# Patient Record
Sex: Female | Born: 1985 | Hispanic: No | Marital: Married | Smoking: Former smoker
Health system: Southern US, Community
[De-identification: ages and names within clinical notes are randomized; demographics above are authoritative.]

## PROBLEM LIST (undated history)

## (undated) DIAGNOSIS — J45909 Unspecified asthma, uncomplicated: Secondary | ICD-10-CM

## (undated) DIAGNOSIS — M722 Plantar fascial fibromatosis: Secondary | ICD-10-CM

## (undated) DIAGNOSIS — E669 Obesity, unspecified: Secondary | ICD-10-CM

## (undated) DIAGNOSIS — L309 Dermatitis, unspecified: Secondary | ICD-10-CM

## (undated) DIAGNOSIS — Z720 Tobacco use: Secondary | ICD-10-CM

## (undated) HISTORY — DX: Obesity, unspecified: E66.9

## (undated) HISTORY — DX: Unspecified asthma, uncomplicated: J45.909

## (undated) HISTORY — DX: Dermatitis, unspecified: L30.9

## (undated) HISTORY — PX: EYE SURGERY: SHX253

## (undated) HISTORY — DX: Plantar fascial fibromatosis: M72.2

## (undated) HISTORY — DX: Tobacco use: Z72.0

## (undated) HISTORY — PX: WISDOM TOOTH EXTRACTION: SHX21

---

## 2018-12-27 ENCOUNTER — Other Ambulatory Visit: Payer: Self-pay

## 2018-12-27 DIAGNOSIS — Z20822 Contact with and (suspected) exposure to covid-19: Secondary | ICD-10-CM

## 2018-12-29 LAB — NOVEL CORONAVIRUS, NAA: SARS-CoV-2, NAA: NOT DETECTED

## 2019-01-03 ENCOUNTER — Telehealth: Payer: Self-pay | Admitting: General Practice

## 2019-01-03 NOTE — Telephone Encounter (Signed)
Negative COVID results given. Patient results "NOT Detected." Caller expressed understanding. ° °

## 2019-05-12 ENCOUNTER — Other Ambulatory Visit: Payer: 59

## 2019-07-19 ENCOUNTER — Telehealth: Payer: Self-pay | Admitting: Cardiology

## 2019-07-19 NOTE — Telephone Encounter (Signed)
error 

## 2019-11-10 ENCOUNTER — Telehealth: Payer: 59

## 2019-11-21 ENCOUNTER — Other Ambulatory Visit: Payer: Self-pay

## 2019-11-21 ENCOUNTER — Encounter: Payer: Self-pay | Admitting: Internal Medicine

## 2019-11-21 ENCOUNTER — Ambulatory Visit: Payer: 59 | Admitting: Internal Medicine

## 2019-11-21 VITALS — BP 110/80 | HR 76 | Temp 98.2°F | Ht 66.0 in | Wt 223.5 lb

## 2019-11-21 DIAGNOSIS — L309 Dermatitis, unspecified: Secondary | ICD-10-CM

## 2019-11-21 DIAGNOSIS — Z23 Encounter for immunization: Secondary | ICD-10-CM | POA: Diagnosis not present

## 2019-11-21 DIAGNOSIS — E669 Obesity, unspecified: Secondary | ICD-10-CM | POA: Diagnosis not present

## 2019-11-21 DIAGNOSIS — Z0000 Encounter for general adult medical examination without abnormal findings: Secondary | ICD-10-CM

## 2019-11-21 DIAGNOSIS — Z720 Tobacco use: Secondary | ICD-10-CM

## 2019-11-21 MED ORDER — NICOTINE 21 MG/24HR TD PT24: 21.0000 mg | MEDICATED_PATCH | Freq: Every day | TRANSDERMAL | 1 refills | Status: DC

## 2019-11-21 MED ORDER — TRIAMCINOLONE ACETONIDE 0.1 % EX CREA: 1.0000 "application " | TOPICAL_CREAM | Freq: Two times a day (BID) | CUTANEOUS | 0 refills | Status: DC

## 2019-11-21 NOTE — Progress Notes (Signed)
New Patient Office Visit     This visit occurred during the SARS-CoV-2 public health emergency.  Safety protocols were in place, including screening questions prior to the visit, additional usage of staff PPE, and extensive cleaning of exam room while observing appropriate contact time as indicated for disinfecting solutions.    CC/Reason for Visit: Establish care, annual preventive exam, discuss acute concerns Previous PCP: None Last Visit: Unknown  HPI: Dana Adkins is a 34 y.o. female who is coming in today for the above mentioned reasons. Past Medical History is significant for: Tobacco abuse, has been smoking 1 pack a day for about 6 years, she is interested in quitting.  She also has eczema and is wondering what she can do about this.  She used to be in [employer], currently works for the [employer], is married to a same-sex partner.  She moved to the area about 3 years ago.  They have no children.  She does not drink, she has no known drug allergies, past surgical history is only significant for LASEK eye surgery in 2010.  Her father is currently battling liver cancer.  She is due for Covid, Tdap, flu vaccines.   Past Medical/Surgical History: Past Medical History:  Diagnosis Date  . Eczema   . Obesity (BMI 35.0-39.9 without comorbidity)   . Tobacco abuse     History reviewed. No pertinent surgical history.  Social History:  reports that she has been smoking cigarettes. She started smoking about 6 years ago. She has been smoking about 1.00 pack per day. She has never used smokeless tobacco. She reports that she does not drink alcohol and does not use drugs.  Allergies: No Known Allergies  Family History:  Family History  Problem Relation Age of Onset  . Liver cancer Father      Current Outpatient Medications:  .  nicotine (NICODERM CQ - DOSED IN MG/24 HOURS) 21 mg/24hr patch, Place 1 patch (21 mg total) onto the skin daily., Disp: 28 patch, Rfl: 1 .   triamcinolone cream (KENALOG) 0.1 %, Apply 1 application topically 2 (two) times daily., Disp: 30 g, Rfl: 0  Review of Systems:  Constitutional: Denies fever, chills, diaphoresis, appetite change and fatigue.  HEENT: Denies photophobia, eye pain, redness, hearing loss, ear pain, congestion, sore throat, rhinorrhea, sneezing, mouth sores, trouble swallowing, neck pain, neck stiffness and tinnitus.   Respiratory: Denies SOB, DOE, cough, chest tightness,  and wheezing.   Cardiovascular: Denies chest pain, palpitations and leg swelling.  Gastrointestinal: Denies nausea, vomiting, abdominal pain, diarrhea, constipation, blood in stool and abdominal distention.  Genitourinary: Denies dysuria, urgency, frequency, hematuria, flank pain and difficulty urinating.  Endocrine: Denies: hot or cold intolerance, sweats, changes in hair or nails, polyuria, polydipsia. Musculoskeletal: Denies myalgias, back pain, joint swelling, arthralgias and gait problem.  Skin: Denies pallor, rash and wound.  Neurological: Denies dizziness, seizures, syncope, weakness, light-headedness, numbness and headaches.  Hematological: Denies adenopathy. Easy bruising, personal or family bleeding history  Psychiatric/Behavioral: Denies suicidal ideation, mood changes, confusion, nervousness, sleep disturbance and agitation    Physical Exam: Vitals:   11/21/19 1019  BP: 110/80  Pulse: 76  Temp: 98.2 F (36.8 C)  TempSrc: Oral  SpO2: 98%  Weight: 223 lb 8 oz (101.4 kg)  Height: 5' 6"  (1.676 m)   Body mass index is 36.07 kg/m.  Constitutional: NAD, calm, comfortable Eyes: PERRL, lids and conjunctivae normal ENMT: Mucous membranes are moist.  Respiratory: clear to auscultation bilaterally, no wheezing,  no crackles. Normal respiratory effort. No accessory muscle use.  Cardiovascular: Regular rate and rhythm, no murmurs / rubs / gallops. No extremity edema.  Abdomen: no tenderness, no masses palpated. No hepatosplenomegaly.  Bowel sounds positive.  Musculoskeletal: no clubbing / cyanosis. No joint deformity upper and lower extremities. Good ROM, no contractures. Normal muscle tone.  Neurologic: CN 2-12 grossly intact. Sensation intact, DTR normal. Strength 5/5 in all 4.  Psychiatric: Normal judgment and insight. Alert and oriented x 3. Normal mood.    Impression and Plan:  Encounter for preventive health examination  -I have advised routine eye and dental care. -She has agreed to Tdap booster today, declines flu and Covid vaccines. -Screening labs today. -Healthy lifestyle discussed in detail. -Commence routine colon cancer screening age 33, breast cancer screening age 1. -She has a GYN who does her Pap smears.  Tobacco abuse  - Plan: nicotine (NICODERM CQ - DOSED IN MG/24 HOURS) 21 mg/24hr patch -She will see me in 6 weeks for follow-up.  Obesity (BMI 35.0-39.9 without comorbidity) -Discussed healthy lifestyle, including increased physical activity and better food choices to promote weight loss. -She has been walking on a daily basis about 3 miles.  Eczema, unspecified type  - Plan: triamcinolone cream (KENALOG) 0.1 %     Patient Instructions  -Nice seeing you today!!  -Lab work today; will notify you once results are available.  -Start nicotine patches. Change daily at the same time.  -Triamcinolone cream as needed for eczema.  -Tetanus booster today.  -Consider getting your COVID vaccines.  -Remember to schedule your eye and dental exams.  -Schedule follow up in 6 weeks for smoking.   Preventive Care 81-56 Years Old, Female Preventive care refers to visits with your health care provider and lifestyle choices that can promote health and wellness. This includes:  A yearly physical exam. This may also be called an annual well check.  Regular dental visits and eye exams.  Immunizations.  Screening for certain conditions.  Healthy lifestyle choices, such as eating a healthy diet,  getting regular exercise, not using drugs or products that contain nicotine and tobacco, and limiting alcohol use. What can I expect for my preventive care visit? Physical exam Your health care provider will check your:  Height and weight. This may be used to calculate body mass index (BMI), which tells if you are at a healthy weight.  Heart rate and blood pressure.  Skin for abnormal spots. Counseling Your health care provider may ask you questions about your:  Alcohol, tobacco, and drug use.  Emotional well-being.  Home and relationship well-being.  Sexual activity.  Eating habits.  Work and work Statistician.  Method of birth control.  Menstrual cycle.  Pregnancy history. What immunizations do I need?  Influenza (flu) vaccine  This is recommended every year. Tetanus, diphtheria, and pertussis (Tdap) vaccine  You may need a Td booster every 10 years. Varicella (chickenpox) vaccine  You may need this if you have not been vaccinated. Human papillomavirus (HPV) vaccine  If recommended by your health care provider, you may need three doses over 6 months. Measles, mumps, and rubella (MMR) vaccine  You may need at least one dose of MMR. You may also need a second dose. Meningococcal conjugate (MenACWY) vaccine  One dose is recommended if you are age 60-21 years and a first-year college student living in a residence hall, or if you have one of several medical conditions. You may also need additional booster doses. Pneumococcal conjugate (PCV13)  vaccine  You may need this if you have certain conditions and were not previously vaccinated. Pneumococcal polysaccharide (PPSV23) vaccine  You may need one or two doses if you smoke cigarettes or if you have certain conditions. Hepatitis A vaccine  You may need this if you have certain conditions or if you travel or work in places where you may be exposed to hepatitis A. Hepatitis B vaccine  You may need this if you have  certain conditions or if you travel or work in places where you may be exposed to hepatitis B. Haemophilus influenzae type b (Hib) vaccine  You may need this if you have certain conditions. You may receive vaccines as individual doses or as more than one vaccine together in one shot (combination vaccines). Talk with your health care provider about the risks and benefits of combination vaccines. What tests do I need?  Blood tests  Lipid and cholesterol levels. These may be checked every 5 years starting at age 48.  Hepatitis C test.  Hepatitis B test. Screening  Diabetes screening. This is done by checking your blood sugar (glucose) after you have not eaten for a while (fasting).  Sexually transmitted disease (STD) testing.  BRCA-related cancer screening. This may be done if you have a family history of breast, ovarian, tubal, or peritoneal cancers.  Pelvic exam and Pap test. This may be done every 3 years starting at age 64. Starting at age 3, this may be done every 5 years if you have a Pap test in combination with an HPV test. Talk with your health care provider about your test results, treatment options, and if necessary, the need for more tests. Follow these instructions at home: Eating and drinking   Eat a diet that includes fresh fruits and vegetables, whole grains, lean protein, and low-fat dairy.  Take vitamin and mineral supplements as recommended by your health care provider.  Do not drink alcohol if: ? Your health care provider tells you not to drink. ? You are pregnant, may be pregnant, or are planning to become pregnant.  If you drink alcohol: ? Limit how much you have to 0-1 drink a day. ? Be aware of how much alcohol is in your drink. In the [place] one drink equals one 12 oz bottle of beer (355 mL), one 5 oz glass of wine (148 mL), or one 1 oz glass of hard liquor (44 mL). Lifestyle  Take daily care of your teeth and gums.  Stay active. Exercise for at least  30 minutes on 5 or more days each week.  Do not use any products that contain nicotine or tobacco, such as cigarettes, e-cigarettes, and chewing tobacco. If you need help quitting, ask your health care provider.  If you are sexually active, practice safe sex. Use a condom or other form of birth control (contraception) in order to prevent pregnancy and STIs (sexually transmitted infections). If you plan to become pregnant, see your health care provider for a preconception visit. What's next?  Visit your health care provider once a year for a well check visit.  Ask your health care provider how often you should have your eyes and teeth checked.  Stay up to date on all vaccines. This information is not intended to replace advice given to you by your health care provider. Make sure you discuss any questions you have with your health care provider. Document Revised: 12/16/2017 Document Reviewed: 12/16/2017 Elsevier Patient Education  2020 [employer].  Lelon Frohlich, MD [facility]

## 2019-11-21 NOTE — Addendum Note (Signed)
Addended by: Kern Reap B on: 11/21/2019 12:58 PM   Modules accepted: Orders

## 2019-11-21 NOTE — Patient Instructions (Signed)
-Nice seeing you today!!  -Lab work today; will notify you once results are available.  -Start nicotine patches. Change daily at the same time.  -Triamcinolone cream as needed for eczema.  -Tetanus booster today.  -Consider getting your COVID vaccines.  -Remember to schedule your eye and dental exams.  -Schedule follow up in 6 weeks for smoking.   Preventive Care 19-35 Years Old, Female Preventive care refers to visits with your health care provider and lifestyle choices that can promote health and wellness. This includes:  A yearly physical exam. This may also be called an annual well check.  Regular dental visits and eye exams.  Immunizations.  Screening for certain conditions.  Healthy lifestyle choices, such as eating a healthy diet, getting regular exercise, not using drugs or products that contain nicotine and tobacco, and limiting alcohol use. What can I expect for my preventive care visit? Physical exam Your health care provider will check your:  Height and weight. This may be used to calculate body mass index (BMI), which tells if you are at a healthy weight.  Heart rate and blood pressure.  Skin for abnormal spots. Counseling Your health care provider may ask you questions about your:  Alcohol, tobacco, and drug use.  Emotional well-being.  Home and relationship well-being.  Sexual activity.  Eating habits.  Work and work Statistician.  Method of birth control.  Menstrual cycle.  Pregnancy history. What immunizations do I need?  Influenza (flu) vaccine  This is recommended every year. Tetanus, diphtheria, and pertussis (Tdap) vaccine  You may need a Td booster every 10 years. Varicella (chickenpox) vaccine  You may need this if you have not been vaccinated. Human papillomavirus (HPV) vaccine  If recommended by your health care provider, you may need three doses over 6 months. Measles, mumps, and rubella (MMR) vaccine  You may need at  least one dose of MMR. You may also need a second dose. Meningococcal conjugate (MenACWY) vaccine  One dose is recommended if you are age 16-21 years and a first-year college student living in a residence hall, or if you have one of several medical conditions. You may also need additional booster doses. Pneumococcal conjugate (PCV13) vaccine  You may need this if you have certain conditions and were not previously vaccinated. Pneumococcal polysaccharide (PPSV23) vaccine  You may need one or two doses if you smoke cigarettes or if you have certain conditions. Hepatitis A vaccine  You may need this if you have certain conditions or if you travel or work in places where you may be exposed to hepatitis A. Hepatitis B vaccine  You may need this if you have certain conditions or if you travel or work in places where you may be exposed to hepatitis B. Haemophilus influenzae type b (Hib) vaccine  You may need this if you have certain conditions. You may receive vaccines as individual doses or as more than one vaccine together in one shot (combination vaccines). Talk with your health care provider about the risks and benefits of combination vaccines. What tests do I need?  Blood tests  Lipid and cholesterol levels. These may be checked every 5 years starting at age 35.  Hepatitis C test.  Hepatitis B test. Screening  Diabetes screening. This is done by checking your blood sugar (glucose) after you have not eaten for a while (fasting).  Sexually transmitted disease (STD) testing.  BRCA-related cancer screening. This may be done if you have a family history of breast, ovarian, tubal, or  peritoneal cancers.  Pelvic exam and Pap test. This may be done every 3 years starting at age 35. Starting at age 75, this may be done every 5 years if you have a Pap test in combination with an HPV test. Talk with your health care provider about your test results, treatment options, and if necessary, the  need for more tests. Follow these instructions at home: Eating and drinking   Eat a diet that includes fresh fruits and vegetables, whole grains, lean protein, and low-fat dairy.  Take vitamin and mineral supplements as recommended by your health care provider.  Do not drink alcohol if: ? Your health care provider tells you not to drink. ? You are pregnant, may be pregnant, or are planning to become pregnant.  If you drink alcohol: ? Limit how much you have to 0-1 drink a day. ? Be aware of how much alcohol is in your drink. In the [place] one drink equals one 12 oz bottle of beer (355 mL), one 5 oz glass of wine (148 mL), or one 1 oz glass of hard liquor (44 mL). Lifestyle  Take daily care of your teeth and gums.  Stay active. Exercise for at least 30 minutes on 5 or more days each week.  Do not use any products that contain nicotine or tobacco, such as cigarettes, e-cigarettes, and chewing tobacco. If you need help quitting, ask your health care provider.  If you are sexually active, practice safe sex. Use a condom or other form of birth control (contraception) in order to prevent pregnancy and STIs (sexually transmitted infections). If you plan to become pregnant, see your health care provider for a preconception visit. What's next?  Visit your health care provider once a year for a well check visit.  Ask your health care provider how often you should have your eyes and teeth checked.  Stay up to date on all vaccines. This information is not intended to replace advice given to you by your health care provider. Make sure you discuss any questions you have with your health care provider. Document Revised: 12/16/2017 Document Reviewed: 12/16/2017 Elsevier Patient Education  2020 [employer].

## 2019-11-22 ENCOUNTER — Encounter: Payer: Self-pay | Admitting: Internal Medicine

## 2019-11-22 DIAGNOSIS — R7401 Elevation of levels of liver transaminase levels: Secondary | ICD-10-CM | POA: Insufficient documentation

## 2019-11-22 LAB — COMPREHENSIVE METABOLIC PANEL
AG Ratio: 1.5 (calc) (ref 1.0–2.5)
ALT: 34 U/L — ABNORMAL HIGH (ref 6–29)
AST: 31 U/L — ABNORMAL HIGH (ref 10–30)
Albumin: 4 g/dL (ref 3.6–5.1)
Alkaline phosphatase (APISO): 62 U/L (ref 31–125)
BUN: 9 mg/dL (ref 7–25)
CO2: 21 mmol/L (ref 20–32)
Calcium: 8.9 mg/dL (ref 8.6–10.2)
Chloride: 109 mmol/L (ref 98–110)
Creat: 0.89 mg/dL (ref 0.50–1.10)
Globulin: 2.7 g/dL (calc) (ref 1.9–3.7)
Glucose, Bld: 77 mg/dL (ref 65–99)
Potassium: 4.6 mmol/L (ref 3.5–5.3)
Sodium: 138 mmol/L (ref 135–146)
Total Bilirubin: 0.9 mg/dL (ref 0.2–1.2)
Total Protein: 6.7 g/dL (ref 6.1–8.1)

## 2019-11-22 LAB — LIPID PANEL
Cholesterol: 176 mg/dL (ref <200)
HDL: 65 mg/dL (ref >=50)
LDL Cholesterol (Calc): 97 mg/dL (calc)
Non-HDL Cholesterol (Calc): 111 mg/dL (calc) (ref <130)
Total CHOL/HDL Ratio: 2.7 (calc) (ref <5.0)
Triglycerides: 46 mg/dL (ref <150)

## 2019-11-22 LAB — CBC WITH DIFFERENTIAL/PLATELET
Absolute Monocytes: 464 cells/uL (ref 200–950)
Basophils Absolute: 53 cells/uL (ref 0–200)
Basophils Relative: 0.7 %
Eosinophils Absolute: 129 cells/uL (ref 15–500)
Eosinophils Relative: 1.7 %
HCT: 40.8 % (ref 35.0–45.0)
Hemoglobin: 13.4 g/dL (ref 11.7–15.5)
Lymphs Abs: 1634 cells/uL (ref 850–3900)
MCH: 28.6 pg (ref 27.0–33.0)
MCHC: 32.8 g/dL (ref 32.0–36.0)
MCV: 87 fL (ref 80.0–100.0)
MPV: 11.2 fL (ref 7.5–12.5)
Monocytes Relative: 6.1 %
Neutro Abs: 5320 cells/uL (ref 1500–7800)
Neutrophils Relative %: 70 %
Platelets: 235 10*3/uL (ref 140–400)
RBC: 4.69 10*6/uL (ref 3.80–5.10)
RDW: 13.9 % (ref 11.0–15.0)
Total Lymphocyte: 21.5 %
WBC: 7.6 10*3/uL (ref 3.8–10.8)

## 2019-11-29 ENCOUNTER — Telehealth: Payer: Self-pay | Admitting: Internal Medicine

## 2019-11-29 NOTE — Telephone Encounter (Signed)
Pt was returning Rachel's call about labs. Pt can be reached at [phone]

## 2019-12-01 ENCOUNTER — Other Ambulatory Visit: Payer: Self-pay | Admitting: *Deleted

## 2019-12-01 DIAGNOSIS — R945 Abnormal results of liver function studies: Secondary | ICD-10-CM

## 2019-12-01 NOTE — Telephone Encounter (Signed)
Spoke with patient and reviewed lab results. 

## 2020-01-15 ENCOUNTER — Other Ambulatory Visit: Payer: Self-pay

## 2020-03-12 ENCOUNTER — Emergency Department (HOSPITAL_COMMUNITY)
Admission: EM | Admit: 2020-03-12 | Discharge: 2020-03-12 | Disposition: A | Discharge status: Home / Self Care | Payer: 59 | Attending: Emergency Medicine | Admitting: Emergency Medicine

## 2020-03-12 ENCOUNTER — Emergency Department (HOSPITAL_COMMUNITY): Payer: 59

## 2020-03-12 ENCOUNTER — Encounter (HOSPITAL_COMMUNITY): Payer: Self-pay

## 2020-03-12 ENCOUNTER — Other Ambulatory Visit: Payer: Self-pay

## 2020-03-12 DIAGNOSIS — R1011 Right upper quadrant pain: Secondary | ICD-10-CM | POA: Diagnosis not present

## 2020-03-12 DIAGNOSIS — F17210 Nicotine dependence, cigarettes, uncomplicated: Secondary | ICD-10-CM | POA: Diagnosis not present

## 2020-03-12 LAB — COMPREHENSIVE METABOLIC PANEL
ALT: 25 U/L (ref 0–44)
AST: 23 U/L (ref 15–41)
Albumin: 4 g/dL (ref 3.5–5.0)
Alkaline Phosphatase: 69 U/L (ref 38–126)
Anion gap: 8 (ref 5–15)
BUN: 7 mg/dL (ref 6–20)
CO2: 23 mmol/L (ref 22–32)
Calcium: 8.5 mg/dL — ABNORMAL LOW (ref 8.9–10.3)
Chloride: 107 mmol/L (ref 98–111)
Creatinine, Ser: 0.7 mg/dL (ref 0.44–1.00)
GFR, Estimated: >60 mL/min (ref >60)
Glucose, Bld: 84 mg/dL (ref 70–99)
Potassium: 3.7 mmol/L (ref 3.5–5.1)
Sodium: 138 mmol/L (ref 135–145)
Total Bilirubin: 0.8 mg/dL (ref 0.3–1.2)
Total Protein: 7.2 g/dL (ref 6.5–8.1)

## 2020-03-12 LAB — LIPASE, BLOOD: Lipase: 37 U/L (ref 11–51)

## 2020-03-12 LAB — URINALYSIS, ROUTINE W REFLEX MICROSCOPIC
Bilirubin Urine: NEGATIVE
Glucose, UA: NEGATIVE mg/dL
Hgb urine dipstick: NEGATIVE
Ketones, ur: NEGATIVE mg/dL
Leukocytes,Ua: NEGATIVE
Nitrite: NEGATIVE
Protein, ur: NEGATIVE mg/dL
Specific Gravity, Urine: 1.01 (ref 1.005–1.030)
pH: 6 (ref 5.0–8.0)

## 2020-03-12 LAB — CBC
HCT: 40 % (ref 36.0–46.0)
Hemoglobin: 13.5 g/dL (ref 12.0–15.0)
MCH: 28.8 pg (ref 26.0–34.0)
MCHC: 33.8 g/dL (ref 30.0–36.0)
MCV: 85.3 fL (ref 80.0–100.0)
Platelets: 224 10*3/uL (ref 150–400)
RBC: 4.69 MIL/uL (ref 3.87–5.11)
RDW: 14 % (ref 11.5–15.5)
WBC: 9.4 10*3/uL (ref 4.0–10.5)
nRBC: 0 % (ref 0.0–0.2)

## 2020-03-12 LAB — I-STAT BETA HCG BLOOD, ED (MC, WL, AP ONLY): I-stat hCG, quantitative: <5 m[IU]/mL (ref <5)

## 2020-03-12 MED ORDER — MORPHINE SULFATE (PF) 4 MG/ML IV SOLN
4.0000 mg | Freq: Once | INTRAVENOUS | Status: AC
  Administered 2020-03-12: 4 mg via INTRAVENOUS
  Filled 2020-03-12: qty 1

## 2020-03-12 MED ORDER — SODIUM CHLORIDE (PF) 0.9 % IJ SOLN
INTRAMUSCULAR | Status: AC
  Filled 2020-03-12: qty 50

## 2020-03-12 MED ORDER — OMEPRAZOLE 20 MG PO CPDR: 20.0000 mg | DELAYED_RELEASE_CAPSULE | Freq: Every day | ORAL | 0 refills | Status: DC

## 2020-03-12 MED ORDER — IOHEXOL 300 MG/ML  SOLN
100.0000 mL | Freq: Once | INTRAMUSCULAR | Status: AC | PRN
  Administered 2020-03-12: 100 mL via INTRAVENOUS

## 2020-03-12 MED ORDER — DICYCLOMINE HCL 20 MG PO TABS: 20.0000 mg | ORAL_TABLET | Freq: Two times a day (BID) | ORAL | 0 refills | Status: DC | PRN

## 2020-03-12 NOTE — ED Provider Notes (Signed)
[facility]   CSN: [id number] Arrival date & time: 03/12/20  1651     History Chief Complaint  Patient presents with  . Abdominal Pain    Dana Adkins is a 34 y.o. female.  She is complaining of acute onset of stabbing right upper quadrant abdominal pain radiating through to her back that began about 30 minutes prior to arrival.  She said it sliding off now but was severe initially.  It occurred after she had eaten some M&Ms.  Not associate with nausea vomiting fevers chills shortness of breath diaphoresis or urinary symptoms.  No prior history of same pain.  No prior surgical history  The history is provided by the patient.  Abdominal Pain Pain location:  RUQ Pain quality: stabbing   Pain radiates to:  Back Pain severity:  Severe Onset quality:  Sudden Duration:  1 hour Timing:  Constant Progression:  Partially resolved Chronicity:  New Context: eating   Relieved by:  None tried Worsened by:  Nothing Ineffective treatments:  None tried Associated symptoms: no chest pain, no cough, no diarrhea, no dysuria, no fever, no nausea, no shortness of breath, no sore throat and no vomiting        Past Medical History:  Diagnosis Date  . Eczema   . Obesity (BMI 35.0-39.9 without comorbidity)   . Tobacco abuse     Patient Active Problem List   Diagnosis Date Noted  . Transaminitis 11/22/2019  . Tobacco abuse   . Obesity (BMI 35.0-39.9 without comorbidity)   . Eczema     History reviewed. No pertinent surgical history.   OB History   No obstetric history on file.     Family History  Problem Relation Age of Onset  . Liver cancer Father     Social History   Tobacco Use  . Smoking status: Current Every Day Smoker    Packs/day: 1.00    Types: Cigarettes    Start date: 2015  . Smokeless tobacco: Never Used  Substance Use Topics  . Alcohol use: Never  . Drug use: Never    Home Medications Prior to  Admission medications   Medication Sig Start Date End Date Taking? Authorizing Provider  nicotine (NICODERM CQ - DOSED IN MG/24 HOURS) 21 mg/24hr patch Place 1 patch (21 mg total) onto the skin daily. 11/21/19   Philip Aspen, Limmie Patricia, MD  triamcinolone cream (KENALOG) 0.1 % Apply 1 application topically 2 (two) times daily. 11/21/19   Philip Aspen, Limmie Patricia, MD    Allergies    Patient has no known allergies.  Review of Systems   Review of Systems  Constitutional: Negative for fever.  HENT: Negative for sore throat.   Eyes: Negative for visual disturbance.  Respiratory: Negative for cough and shortness of breath.   Cardiovascular: Negative for chest pain.  Gastrointestinal: Positive for abdominal pain. Negative for diarrhea, nausea and vomiting.  Genitourinary: Negative for dysuria.  Musculoskeletal: Positive for back pain.  Skin: Negative for rash.  Neurological: Negative for headaches.    Physical Exam Updated Vital Signs BP (!) 143/91 (BP Location: Left Arm)   Pulse 71   Temp 98.2 F (36.8 C) (Oral)   Resp 16   Ht 5\' 6"  (1.676 m)   Wt 99.8 kg   LMP 02/22/2020 (Approximate)   SpO2 100%   BMI 35.51 kg/m   Physical Exam Vitals and nursing note reviewed.  Constitutional:      General: She is not  in acute distress.    Appearance: Normal appearance. She is well-developed.  HENT:     Head: Normocephalic and atraumatic.  Eyes:     Conjunctiva/sclera: Conjunctivae normal.  Cardiovascular:     Rate and Rhythm: Normal rate and regular rhythm.     Heart sounds: No murmur heard.   Pulmonary:     Effort: Pulmonary effort is normal. No respiratory distress.     Breath sounds: Normal breath sounds.  Abdominal:     Palpations: Abdomen is soft.     Tenderness: There is abdominal tenderness in the right upper quadrant. There is no guarding or rebound. Negative signs include Murphy's sign.  Musculoskeletal:        General: No deformity or signs of injury. Normal range of  motion.     Cervical back: Neck supple.  Skin:    General: Skin is warm and dry.     Capillary Refill: Capillary refill takes less than 2 seconds.  Neurological:     General: No focal deficit present.     Mental Status: She is alert.     ED Results / Procedures / Treatments   Labs (all labs ordered are listed, but only abnormal results are displayed) Labs Reviewed  COMPREHENSIVE METABOLIC PANEL - Abnormal; Notable for the following components:      Result Value   Calcium 8.5 (*)    All other components within normal limits  LIPASE, BLOOD  CBC  URINALYSIS, ROUTINE W REFLEX MICROSCOPIC  I-STAT BETA HCG BLOOD, ED ([facility], [facility], [facility] ONLY)    EKG None  Radiology CT Abdomen Pelvis W Contrast  Result Date: 03/12/2020 CLINICAL DATA:  Right abdominal pain EXAM: CT ABDOMEN AND PELVIS WITH CONTRAST TECHNIQUE: Multidetector CT imaging of the abdomen and pelvis was performed using the standard protocol following bolus administration of intravenous contrast. CONTRAST:  OMNIPAQUE IOHEXOL 300 MG/ML  SOLN COMPARISON:  None. FINDINGS: Lower chest: No acute abnormality. Hepatobiliary: No focal liver abnormality is seen. No gallstones, gallbladder wall thickening, or biliary dilatation. Pancreas: Unremarkable. No pancreatic ductal dilatation or surrounding inflammatory changes. Spleen: Normal in size without focal abnormality. Adrenals/Urinary Tract: Adrenals and kidneys are unremarkable. The bladder is partially distended and displaced by uterus. Stomach/Bowel: Stomach is within normal limits. Bowel is normal in caliber. Normal appendix. Vascular/Lymphatic: No significant vascular abnormality. No enlarged lymph nodes. Reproductive: Bulky, myomatous uterus.  No adnexal mass. Other: No ascites.  Abdominal wall is unremarkable. Musculoskeletal: No significant or acute osseous abnormality. IMPRESSION: No acute abnormality.  Bulky fibroid uterus. Electronically Signed   By: Guadlupe Spanish M.D.   On:  03/12/2020 20:49   US Abdomen Limited RUQ (LIVER/GB)  Result Date: 03/12/2020 CLINICAL DATA:  Right upper quadrant pain x1 day. EXAM: ULTRASOUND ABDOMEN LIMITED RIGHT UPPER QUADRANT COMPARISON:  None. FINDINGS: Gallbladder: No gallstones or wall thickening visualized (1.5 mm). No sonographic Murphy sign noted by sonographer. Common bile duct: Diameter: 4.3 mm Liver: No focal lesion identified. Within normal limits in parenchymal echogenicity. Portal vein is patent on color Doppler imaging with normal direction of blood flow towards the liver. Other: None. IMPRESSION: Normal right upper quadrant ultrasound. Electronically Signed   By: Aram Candela M.D.   On: 03/12/2020 19:38    Procedures Procedures (including critical care time)  Medications Ordered in ED Medications  morphine 4 MG/ML injection 4 mg (4 mg Intravenous Given 03/12/20 2020)  iohexol (OMNIPAQUE) 300 MG/ML solution 100 mL (100 mLs Intravenous Contrast Given 03/12/20 2032)  sodium chloride (PF) 0.9 %  injection (  Given by Other 03/12/20 2057)    ED Course  I have reviewed the triage vital signs and the nursing notes.  Pertinent labs & imaging results that were available during my care of the patient were reviewed by me and considered in my medical decision making (see chart for details).  Clinical Course as of Mar 13 938  Tue Mar 12, 2020  1954 Reassessed patient, she said her pain is now more to her right lower quadrant and is picking up in intensity again.  Will order her some pain medicine and get a CT.   [MB]  2057 Patient CT also does not show an obvious explanation for his symptoms.  No signs of stones and a normal appendix.  Reviewed with her.  She is comfortable plan for outpatient follow-up with her PCP.  We will put her on a PPI and some Bentyl.   [MB]    Clinical Course User Index [MB] Terrilee Files, MD   MDM Rules/Calculators/A&P                         This patient complains of right upper quadrant  abdominal pain; this involves an extensive number of treatment Options and is a complaint that carries with it a high risk of complications and Morbidity. The differential includes cholelithiasis, cholecystitis, peptic ulcer disease, gas, obstruction, perforation  I ordered, reviewed and interpreted labs, which included CBC with normal white count, normal hemoglobin, chemistries normal other than isolated low calcium, normal LFTs, normal lipase, pregnancy test negative, urinalysis negative I ordered medication IV pain medicine I ordered imaging studies which included right upper quadrant ultrasound and CT abdomen and pelvis with contrast and I independently    visualized and interpreted imaging which showed no gallstones, normal gallbladder, fibroid uterus, otherwise no acute findings Previous records obtained and reviewed in epic, no recent visits  After the interventions stated above, I reevaluated the patient and found patient to be symptomatically improved.  Recommended close follow-up with PCP and will give her information for GI outpatient.  Return instructions discussed.   Final Clinical Impression(s) / ED Diagnoses Final diagnoses:  RUQ abdominal pain    Rx / DC Orders ED Discharge Orders         Ordered    omeprazole (PRILOSEC) 20 MG capsule  Daily        03/12/20 2059    dicyclomine (BENTYL) 20 MG tablet  2 times daily PRN        03/12/20 2059           Terrilee Files, MD 03/13/20 [phone]

## 2020-03-12 NOTE — Discharge Instructions (Addendum)
You were seen in the emergency department for right upper quadrant abdominal pain.  You had blood work, urinalysis, right upper quadrant ultrasound, and a CT of your abdomen and pelvis that did not show an obvious explanation for your pain.  We are putting you on some acid medication and some medication for spasms.  Please follow-up with your primary care doctor.  Return to the emergency department for any worsening or concerning symptoms.  We are also giving you the number for the GI clinic.

## 2020-03-12 NOTE — ED Triage Notes (Signed)
Pt presents with c/o RUQ abdominal pain after eating some m&m's approx 30 minutes ago. Pt denies any V/D.

## 2020-04-02 ENCOUNTER — Encounter: Payer: Self-pay | Admitting: Gastroenterology

## 2020-04-02 ENCOUNTER — Ambulatory Visit: Payer: 59 | Admitting: Gastroenterology

## 2020-04-02 VITALS — BP 128/82 | HR 78 | Ht 66.0 in | Wt 230.0 lb

## 2020-04-02 DIAGNOSIS — R1011 Right upper quadrant pain: Secondary | ICD-10-CM

## 2020-04-02 NOTE — Progress Notes (Signed)
History of Present Illness: This is a 34 year old female referred by Meridee Score, MD for the evaluation of RUQ pain, right costal margin pain.  She developed acute severe pain radiating to her back on November 23 and was evaluated in [facility] ED. RUQ ultrasound was negative. CT [facility] showed uterine fibroids and was otherwise negative. CBC, LFTs and lipase were normal. Her pain resolved in the ED and has not returned. She was prescribed omeprazole and dicyclomine however she has not had symptoms so she has not taken them. Denies weight loss, constipation, diarrhea, change in stool caliber, melena, hematochezia, nausea, vomiting, dysphagia, reflux symptoms, chest pain.      No Known Allergies Outpatient Medications Prior to Visit  Medication Sig Dispense Refill  . dicyclomine (BENTYL) 20 MG tablet Take 1 tablet (20 mg total) by mouth 2 (two) times daily as needed for spasms. 20 tablet 0  . omeprazole (PRILOSEC) 20 MG capsule Take 1 capsule (20 mg total) by mouth daily. 30 capsule 0  . tranexamic acid (LYSTEDA) 650 MG TABS tablet Take 1,300 mg by mouth in the morning and at bedtime. Starts at the beginning of periods for three days.    Marland Kitchen triamcinolone cream (KENALOG) 0.1 % Apply 1 application topically 2 (two) times daily. (Patient taking differently: Apply 1 application topically 2 (two) times daily as needed (itching).) 30 g 0  . nicotine (NICODERM CQ - DOSED IN MG/24 HOURS) 21 mg/24hr patch Place 1 patch (21 mg total) onto the skin daily. (Patient not taking: Reported on 03/12/2020) 28 patch 1   No facility-administered medications prior to visit.   Past Medical History:  Diagnosis Date  . Eczema   . Obesity (BMI 35.0-39.9 without comorbidity)   . Tobacco abuse    Past Surgical History:  Procedure Laterality Date  . EYE SURGERY     lasix  . WISDOM TOOTH EXTRACTION     Social History   Socioeconomic History  . Marital status: Married    Spouse name: Not on file  . Number of children:  Not on file  . Years of education: Not on file  . Highest education level: Not on file  Occupational History  . Not on file  Tobacco Use  . Smoking status: Current Every Day Smoker    Packs/day: 1.00    Types: Cigarettes    Start date: 2015  . Smokeless tobacco: Never Used  Vaping Use  . Vaping Use: Never used  Substance and Sexual Activity  . Alcohol use: Never  . Drug use: Never  . Sexual activity: Not on file  Other Topics Concern  . Not on file  Social History Narrative  . Not on file   Social Determinants of Health   Financial Resource Strain: Not on file  Food Insecurity: Not on file  Transportation Needs: Not on file  Physical Activity: Not on file  Stress: Not on file  Social Connections: Not on file   Family History  Problem Relation Age of Onset  . Liver cancer Father 70  . Stomach cancer Neg Hx   . Colon cancer Neg Hx   . Esophageal cancer Neg Hx   . Pancreatic cancer Neg Hx       Review of Systems: Pertinent positive and negative review of systems were noted in the above HPI section. All other review of systems were otherwise negative.   Physical Exam: General: Well developed, well nourished, no acute distress Head: Normocephalic and atraumatic Eyes:  sclerae  anicteric, EOMI Ears: Normal auditory acuity Mouth: Not examined, mask on during Covid-19 pandemic Neck: Supple, no masses or thyromegaly Lungs: Clear throughout to auscultation Heart: Regular rate and rhythm; no murmurs, rubs or bruits Abdomen: Soft, non tender and non distended. No masses, hepatosplenomegaly or hernias noted. Normal Bowel sounds Rectal: Not done Musculoskeletal: Symmetrical with no gross deformities  Skin: No lesions on visible extremities Pulses:  Normal pulses noted Extremities: No clubbing, cyanosis, edema or deformities noted Neurological: Alert oriented x 4, grossly nonfocal Cervical Nodes:  No significant cervical adenopathy Inguinal Nodes: No significant inguinal  adenopathy Psychological:  Alert and cooperative. Normal mood and affect   Assessment and Recommendations:  1. Acute, severe RUQ pain, right costal margin pain, resolved.  Etiology unclear.  Extensive evaluation in the ED did not uncover a cause.  Since her symptoms have totally resolved we will not plan on further evaluation at this time. If her pain recurs or other GI symptoms develop I recommended she returns for further evaluation.   2.  Uterine fibroids.  Advised her to follow-up with her GYN and PCP.   3.  CRC screening, average risk.  Recommend colonoscopy starting at age 5.   cc: Meridee Score, MD

## 2020-04-02 NOTE — Patient Instructions (Signed)
If you are age 35 or older, your body mass index should be between 23-30. Your Body mass index is 37.12 kg/m. If this is out of the aforementioned range listed, please consider follow up with your Primary Care Provider.  If you are age 71 or younger, your body mass index should be between 19-25. Your Body mass index is 37.12 kg/m. If this is out of the aformentioned range listed, please consider follow up with your Primary Care Provider.    Please follow up with your primary care provider.

## 2020-07-31 ENCOUNTER — Encounter: Payer: Self-pay | Admitting: Internal Medicine

## 2020-07-31 ENCOUNTER — Other Ambulatory Visit: Payer: Self-pay

## 2020-07-31 ENCOUNTER — Ambulatory Visit: Payer: 59 | Admitting: Internal Medicine

## 2020-07-31 VITALS — BP 110/80 | HR 66 | Temp 98.2°F | Wt 230.3 lb

## 2020-07-31 DIAGNOSIS — R21 Rash and other nonspecific skin eruption: Secondary | ICD-10-CM | POA: Diagnosis not present

## 2020-07-31 DIAGNOSIS — L309 Dermatitis, unspecified: Secondary | ICD-10-CM | POA: Diagnosis not present

## 2020-07-31 MED ORDER — METHYLPREDNISOLONE ACETATE 80 MG/ML IJ SUSP
80.0000 mg | Freq: Once | INTRAMUSCULAR | Status: AC
  Administered 2020-07-31: 80 mg via INTRAMUSCULAR

## 2020-07-31 MED ORDER — TRIAMCINOLONE ACETONIDE 0.1 % EX CREA: 1.0000 "application " | TOPICAL_CREAM | Freq: Two times a day (BID) | CUTANEOUS | 2 refills | Status: AC

## 2020-07-31 NOTE — Progress Notes (Signed)
Established Patient Office Visit     This visit occurred during the SARS-CoV-2 public health emergency.  Safety protocols were in place, including screening questions prior to the visit, additional usage of staff PPE, and extensive cleaning of exam room while observing appropriate contact time as indicated for disinfecting solutions.    CC/Reason for Visit: Rash  HPI: Dana Adkins is a 35 y.o. female who is coming in today for the above mentioned reasons. No PMH of significance other than tobacco use. For the past 2 years she has had this rash that has worsened over the past few weeks. No new soaps, lotions, laundry detergents. No new foods. Extremely pruritic. She has scarring. Please see pics:            Past Medical/Surgical History: Past Medical History:  Diagnosis Date  . Eczema   . Obesity (BMI 35.0-39.9 without comorbidity)   . Tobacco abuse     Past Surgical History:  Procedure Laterality Date  . EYE SURGERY     lasix  . WISDOM TOOTH EXTRACTION      Social History:  reports that she has been smoking cigarettes. She started smoking about 7 years ago. She has been smoking about 1.00 pack per day. She has never used smokeless tobacco. She reports that she does not drink alcohol and does not use drugs.  Allergies: No Known Allergies  Family History:  Family History  Problem Relation Age of Onset  . Liver cancer Father 51  . Stomach cancer Neg Hx   . Colon cancer Neg Hx   . Esophageal cancer Neg Hx   . Pancreatic cancer Neg Hx      Current Outpatient Medications:  .  tranexamic acid (LYSTEDA) 650 MG TABS tablet, Take 1,300 mg by mouth in the morning and at bedtime. Starts at the beginning of periods for three days., Disp: , Rfl:  .  triamcinolone cream (KENALOG) 0.1 %, Apply 1 application topically 2 (two) times daily., Disp: 453.6 g, Rfl: 2  Current Facility-Administered Medications:  .  methylPREDNISolone acetate (DEPO-MEDROL) injection 80  mg, 80 mg, Intramuscular, Once, Philip Aspen, Limmie Patricia, MD  Review of Systems:  Constitutional: Denies fever, chills, diaphoresis, appetite change and fatigue.  HEENT: Denies photophobia, eye pain, redness, hearing loss, ear pain, congestion, sore throat, rhinorrhea, sneezing, mouth sores, trouble swallowing, neck pain, neck stiffness and tinnitus.   Respiratory: Denies SOB, DOE, cough, chest tightness,  and wheezing.   Cardiovascular: Denies chest pain, palpitations and leg swelling.  Gastrointestinal: Denies nausea, vomiting, abdominal pain, diarrhea, constipation, blood in stool and abdominal distention.  Genitourinary: Denies dysuria, urgency, frequency, hematuria, flank pain and difficulty urinating.  Endocrine: Denies: hot or cold intolerance, sweats, changes in hair or nails, polyuria, polydipsia. Musculoskeletal: Denies myalgias, back pain, joint swelling, arthralgias and gait problem.  Skin: Denies pallor and wound.  Neurological: Denies dizziness, seizures, syncope, weakness, light-headedness, numbness and headaches.  Hematological: Denies adenopathy. Easy bruising, personal or family bleeding history  Psychiatric/Behavioral: Denies suicidal ideation, mood changes, confusion, nervousness, sleep disturbance and agitation    Physical Exam: Vitals:   07/31/20 1003  BP: 110/80  Pulse: 66  Temp: 98.2 F (36.8 C)  TempSrc: Oral  SpO2: 99%  Weight: 230 lb 4.8 oz (104.5 kg)    Body mass index is 37.17 kg/m.   Constitutional: NAD, calm, comfortable Eyes: PERRL, lids and conjunctivae normal ENMT: Mucous membranes are moist.  Skin: see above pics of disseminated rash. Neurologic: grossly intact and  non-focal Psychiatric: Normal judgment and insight. Alert and oriented x 3. Normal mood.    Impression and Plan:  Rash -Etiology unclear, given extreme pruritis will give 80 mg IM medrol today. -Refer to dermatology for further input.    Chaya Jan,  MD [facility]

## 2020-11-08 ENCOUNTER — Telehealth: Payer: Self-pay | Admitting: Internal Medicine

## 2020-11-08 DIAGNOSIS — F419 Anxiety disorder, unspecified: Secondary | ICD-10-CM

## 2020-11-08 NOTE — Telephone Encounter (Signed)
Patient's ph number [phone]--BZXY pt first  Patient needs a referral for a psychologist.

## 2020-11-12 NOTE — Telephone Encounter (Signed)
Referral placed.

## 2020-12-04 ENCOUNTER — Ambulatory Visit (INDEPENDENT_AMBULATORY_CARE_PROVIDER_SITE_OTHER): Payer: 59 | Admitting: Psychologist

## 2020-12-04 ENCOUNTER — Other Ambulatory Visit: Payer: Self-pay

## 2020-12-04 DIAGNOSIS — F321 Major depressive disorder, single episode, moderate: Secondary | ICD-10-CM

## 2020-12-05 ENCOUNTER — Encounter: Payer: Self-pay | Admitting: Internal Medicine

## 2020-12-05 ENCOUNTER — Ambulatory Visit: Payer: 59 | Admitting: Internal Medicine

## 2020-12-05 VITALS — BP 110/80 | HR 103 | Temp 98.2°F | Wt 229.4 lb

## 2020-12-05 DIAGNOSIS — G43809 Other migraine, not intractable, without status migrainosus: Secondary | ICD-10-CM | POA: Diagnosis not present

## 2020-12-05 DIAGNOSIS — F339 Major depressive disorder, recurrent, unspecified: Secondary | ICD-10-CM

## 2020-12-05 NOTE — Progress Notes (Signed)
Established Patient Office Visit     This visit occurred during the SARS-CoV-2 public health emergency.  Safety protocols were in place, including screening questions prior to the visit, additional usage of staff PPE, and extensive cleaning of exam room while observing appropriate contact time as indicated for disinfecting solutions.    CC/Reason for Visit: Needs forms filled out  HPI: Dana Adkins is a 35 y.o. female who is coming in today for the above mentioned reasons. Past Medical History is significant for: Ongoing tobacco abuse, chronic eczema, depression, anxiety, migraine headaches.  She is currently under the care of of a psychotherapist.  She has been having to take some days off of work due to headaches.  They are requesting FMLA forms filled out.  Headaches are at the back of her head, sometimes with nausea, no vomiting, she does have light sensitivity.  They happen maybe once a week.  She is currently going to the [place] for management of her depression and anxiety.  She is also attending CBT.   Past Medical/Surgical History: Past Medical History:  Diagnosis Date   Eczema    Obesity (BMI 35.0-39.9 without comorbidity)    Tobacco abuse     Past Surgical History:  Procedure Laterality Date   EYE SURGERY     lasix   WISDOM TOOTH EXTRACTION      Social History:  reports that she has been smoking cigarettes. She started smoking about 7 years ago. She has been smoking an average of 1 pack per day. She has never used smokeless tobacco. She reports that she does not drink alcohol and does not use drugs.  Allergies: No Known Allergies  Family History:  Family History  Problem Relation Age of Onset   Liver cancer Father 2   Stomach cancer Neg Hx    Colon cancer Neg Hx    Esophageal cancer Neg Hx    Pancreatic cancer Neg Hx      Current Outpatient Medications:    DUPIXENT 300 MG/2ML SOPN, Inject into the skin., Disp: , Rfl:    triamcinolone cream (KENALOG)  0.1 %, Apply 1 application topically 2 (two) times daily., Disp: 453.6 g, Rfl: 2  Review of Systems:  Constitutional: Denies fever, chills, diaphoresis, appetite change and fatigue.  HEENT: Denies photophobia, eye pain, redness, hearing loss, ear pain, congestion, sore throat, rhinorrhea, sneezing, mouth sores, trouble swallowing, neck pain, neck stiffness and tinnitus.   Respiratory: Denies SOB, DOE, cough, chest tightness,  and wheezing.   Cardiovascular: Denies chest pain, palpitations and leg swelling.  Gastrointestinal: Denies nausea, vomiting, abdominal pain, diarrhea, constipation, blood in stool and abdominal distention.  Genitourinary: Denies dysuria, urgency, frequency, hematuria, flank pain and difficulty urinating.  Endocrine: Denies: hot or cold intolerance, sweats, changes in hair or nails, polyuria, polydipsia. Musculoskeletal: Denies myalgias, back pain, joint swelling, arthralgias and gait problem.  Skin: Denies pallor, rash and wound.  Neurological: Denies dizziness, seizures, syncope, weakness, light-headedness, numbness. Hematological: Denies adenopathy. Easy bruising, personal or family bleeding history  Psychiatric/Behavioral: Denies suicidal ideation,  confusion, nervousness, sleep disturbance and agitation    Physical Exam: Vitals:   12/05/20 1116  BP: 110/80  Pulse: (!) 103  Temp: 98.2 F (36.8 C)  TempSrc: Oral  SpO2: 97%  Weight: 229 lb 6.4 oz (104.1 kg)    Body mass index is 37.03 kg/m.   Constitutional: NAD, calm, comfortable Eyes: PERRL, lids and conjunctivae normal ENMT: Mucous membranes are moist.  Respiratory: clear to auscultation bilaterally,  no wheezing, no crackles. Normal respiratory effort. No accessory muscle use.  Cardiovascular: Regular rate and rhythm, no murmurs / rubs / gallops. No extremity edema.  Neurologic: Grossly intact and nonfocal Psychiatric: Normal judgment and insight. Alert and oriented x 3. Normal mood.    Impression  and Plan:  Other migraine without status migrainosus, not intractable -FMLA forms filled out for total of 4 days a month.  Depression, recurrent (HCC) -Mood is stable, currently under the care of of a psychotherapist.  She is not requesting medications today.  Time spent: 21 minutes reviewing chart, interviewing and examining patient, formulating plan of care and filling out forms requested.    Chaya Jan, MD [facility]

## 2020-12-10 ENCOUNTER — Ambulatory Visit (INDEPENDENT_AMBULATORY_CARE_PROVIDER_SITE_OTHER): Payer: 59 | Admitting: Psychologist

## 2020-12-10 DIAGNOSIS — F321 Major depressive disorder, single episode, moderate: Secondary | ICD-10-CM

## 2020-12-11 ENCOUNTER — Ambulatory Visit (INDEPENDENT_AMBULATORY_CARE_PROVIDER_SITE_OTHER): Payer: 59

## 2020-12-11 ENCOUNTER — Other Ambulatory Visit: Payer: Self-pay

## 2020-12-11 ENCOUNTER — Ambulatory Visit: Payer: 59 | Admitting: Podiatry

## 2020-12-11 DIAGNOSIS — M2142 Flat foot [pes planus] (acquired), left foot: Secondary | ICD-10-CM

## 2020-12-11 DIAGNOSIS — M2141 Flat foot [pes planus] (acquired), right foot: Secondary | ICD-10-CM

## 2020-12-11 DIAGNOSIS — M722 Plantar fascial fibromatosis: Secondary | ICD-10-CM | POA: Diagnosis not present

## 2020-12-11 NOTE — Progress Notes (Signed)
   Subjective:  35 y.o. female presenting today as a new patient for evaluation of bilateral foot pain.  Patient states that she has been diagnosed with plantar fasciitis in the past.  She states that she has very flat feet.  She served in [employer] for 5 years.  She presents for further treatment and evaluation   Past Medical History:  Diagnosis Date   Eczema    Obesity (BMI 35.0-39.9 without comorbidity)    Tobacco abuse        Objective/Physical Exam General: The patient is alert and oriented x3 in no acute distress.  Dermatology: Skin is warm, dry and supple bilateral lower extremities. Negative for open lesions or macerations.  Vascular: Palpable pedal pulses bilaterally. No edema or erythema noted. Capillary refill within normal limits.  Neurological: Epicritic and protective threshold grossly intact bilaterally.   Musculoskeletal Exam: Range of motion within normal limits to all pedal and ankle joints bilateral. Muscle strength 5/5 in all groups bilateral.  Upon weightbearing there is a medial longitudinal arch collapse bilaterally. Remove foot valgus noted to the bilateral lower extremities with excessive pronation upon mid stance.  There is some pain on palpation to the right plantar heel as well consistent with findings of plantar fasciitis  Radiographic Exam:  Normal osseous mineralization. Joint spaces preserved. No fracture/dislocation/boney destruction.   Pes planus noted on radiographic exam lateral views. Decreased calcaneal inclination and metatarsal declination angle is noted. Anterior break in the cyma line noted on lateral views. Medial talar head to deviation noted on AP radiograph.   Assessment: 1. pes planus bilateral 2.  Plantar fasciitis right   Plan of Care:  1. Patient was evaluated. X-Rays reviewed.  2.  Prescription was provided for the patient to take to the [facility] for authorization and approval of custom molded orthotics 3.  Recommend good  supportive shoes 4.  We did discuss conservative versus surgical management.  I do not believe her pes planus deformity is severe enough for surgery.  I do not recommend surgery and recommend conservative management 5.  Return to clinic as needed   Felecia Shelling, DPM [facility]  Dr. Felecia Shelling, DPM    [address] [place], [place] [address]                Office [phone]  Fax [phone]

## 2020-12-13 ENCOUNTER — Telehealth: Payer: Self-pay | Admitting: Internal Medicine

## 2020-12-13 NOTE — Telephone Encounter (Signed)
Patient brought in some paperwork that she needs to be completed by Dr.Hernandez. Paperwork will be placed in the folder.  Patient wants it to faxed to [phone] when completed.  Please advise.

## 2020-12-18 NOTE — Telephone Encounter (Signed)
Left message on machine for patient that Dr Ardyth Harps is unable to complete the form.  Patient requesting form filled out for someone who is not her patient.  Form placed upfront.

## 2020-12-19 ENCOUNTER — Telehealth: Payer: Self-pay | Admitting: Internal Medicine

## 2020-12-19 NOTE — Telephone Encounter (Signed)
Patient called for follow up-Checking with brittney- about FMLA paperwork status

## 2020-12-25 ENCOUNTER — Ambulatory Visit: Payer: 59 | Admitting: Psychologist

## 2020-12-26 ENCOUNTER — Telehealth: Payer: Self-pay | Admitting: Internal Medicine

## 2020-12-26 NOTE — Telephone Encounter (Signed)
There was some incomplete areas on patient's FMLA paperwork.  Highlighted area on form is the incomplete area.  Patient also brought in a sample list of what needs to be on the FMLA form.  Fax to [phone], Attn: [employer]  -Patient needs these faxed by 12/30/20.  If not, please call patient to let her know.

## 2020-12-27 NOTE — Telephone Encounter (Signed)
Placed in Dr Hernandez's folder 

## 2020-12-27 NOTE — Telephone Encounter (Signed)
Form completed, faxed, and confirmed 

## 2021-02-10 ENCOUNTER — Ambulatory Visit: Payer: 59 | Admitting: Family Medicine

## 2021-02-10 ENCOUNTER — Other Ambulatory Visit: Payer: Self-pay

## 2021-02-10 ENCOUNTER — Encounter: Payer: Self-pay | Admitting: Family Medicine

## 2021-02-10 VITALS — BP 115/72 | HR 67 | Temp 98.3°F | Resp 16 | Ht 66.0 in | Wt 234.0 lb

## 2021-02-10 DIAGNOSIS — Z7689 Persons encountering health services in other specified circumstances: Secondary | ICD-10-CM | POA: Diagnosis not present

## 2021-02-10 DIAGNOSIS — G43909 Migraine, unspecified, not intractable, without status migrainosus: Secondary | ICD-10-CM

## 2021-02-10 MED ORDER — SUMATRIPTAN SUCCINATE 100 MG PO TABS: 100.0000 mg | ORAL_TABLET | ORAL | 0 refills | Status: DC | PRN

## 2021-02-10 NOTE — Progress Notes (Signed)
Patient is here to est care.  Patient is concern about her migraines that she's had x 10 months and are getting worst

## 2021-02-10 NOTE — Progress Notes (Signed)
New Patient Office Visit  Subjective:  Patient ID: Dana Adkins, female    DOB: 15-Aug-1985  Age: 35 y.o. MRN: [id number]  CC:  Chief Complaint  Patient presents with   Establish Care   Headache    HPI ELIDE STALZER presents for to establish care. Patient complains of migraine headaches occurring 2-4/month. She takes migraine excedrin for sx which usually lasts about 12 hours. She reports some form of aura. She has been experiencing these headaches for some years.  Past Medical History:  Diagnosis Date   Eczema    Obesity (BMI 35.0-39.9 without comorbidity)    Tobacco abuse     Past Surgical History:  Procedure Laterality Date   EYE SURGERY     lasix   WISDOM TOOTH EXTRACTION      Family History  Problem Relation Age of Onset   Liver cancer Father 88   Stomach cancer Neg Hx    Colon cancer Neg Hx    Esophageal cancer Neg Hx    Pancreatic cancer Neg Hx     Social History   Socioeconomic History   Marital status: Married    Spouse name: Not on file   Number of children: Not on file   Years of education: Not on file   Highest education level: Not on file  Occupational History   Not on file  Tobacco Use   Smoking status: Every Day    Packs/day: 1.00    Types: Cigarettes    Start date: 2015   Smokeless tobacco: Never  Vaping Use   Vaping Use: Never used  Substance and Sexual Activity   Alcohol use: Never   Drug use: Never   Sexual activity: Not on file  Other Topics Concern   Not on file  Social History Narrative   Not on file   Social Determinants of Health   Financial Resource Strain: Not on file  Food Insecurity: Not on file  Transportation Needs: Not on file  Physical Activity: Not on file  Stress: Not on file  Social Connections: Not on file  Intimate Partner Violence: Not on file    ROS Review of Systems  Neurological:  Positive for headaches. Negative for seizures and syncope.  All other systems reviewed and are  negative.  Objective:   Today's Vitals: BP 115/72   Pulse 67   Temp 98.3 F (36.8 C) (Oral)   Resp 16   Ht 5\' 6"  (1.676 m)   Wt 234 lb (106.1 kg)   SpO2 96%   BMI 37.77 kg/m   Physical Exam Vitals and nursing note reviewed.  Constitutional:      General: She is not in acute distress.    Appearance: She is obese.  HENT:     Head: Normocephalic and atraumatic.  Cardiovascular:     Rate and Rhythm: Normal rate and regular rhythm.  Pulmonary:     Effort: Pulmonary effort is normal.     Breath sounds: Normal breath sounds.  Abdominal:     Palpations: Abdomen is soft.     Tenderness: There is no abdominal tenderness.  Neurological:     General: No focal deficit present.     Mental Status: She is alert and oriented to person, place, and time.  Psychiatric:        Mood and Affect: Mood normal.        Behavior: Behavior normal.    Assessment & Plan:   1. Migraine without status migrainosus, not intractable, unspecified  migraine type Discussed in detail. Patient to keep a headache diary. Imitrex prescribed for sx. Will monitor  2. Encounter to establish care    Outpatient Encounter Medications as of 02/10/2021  Medication Sig   DUPIXENT 300 MG/2ML SOPN Inject into the skin.   tranexamic acid (LYSTEDA) 650 MG TABS tablet Take by mouth.   triamcinolone cream (KENALOG) 0.1 % Apply 1 application topically 2 (two) times daily.   No facility-administered encounter medications on file as of 02/10/2021.    Follow-up: Return in about 6 weeks (around 03/24/2021).   Tommie Raymond, MD

## 2021-03-24 ENCOUNTER — Other Ambulatory Visit: Payer: Self-pay

## 2021-03-24 ENCOUNTER — Encounter: Payer: Self-pay | Admitting: Family Medicine

## 2021-03-24 ENCOUNTER — Ambulatory Visit: Payer: 59 | Admitting: Family Medicine

## 2021-03-24 VITALS — BP 111/75 | HR 85 | Temp 98.8°F | Resp 20 | Ht 66.0 in | Wt 231.0 lb

## 2021-03-24 DIAGNOSIS — M545 Low back pain, unspecified: Secondary | ICD-10-CM | POA: Diagnosis not present

## 2021-03-24 DIAGNOSIS — G43909 Migraine, unspecified, not intractable, without status migrainosus: Secondary | ICD-10-CM | POA: Diagnosis not present

## 2021-03-24 DIAGNOSIS — F419 Anxiety disorder, unspecified: Secondary | ICD-10-CM | POA: Diagnosis not present

## 2021-03-24 MED ORDER — MELOXICAM 15 MG PO TABS: 15.0000 mg | ORAL_TABLET | Freq: Every day | ORAL | 3 refills | Status: DC

## 2021-03-24 NOTE — Progress Notes (Signed)
Want to talk about pain in the wrist and in the back.

## 2021-03-25 ENCOUNTER — Encounter: Payer: Self-pay | Admitting: Family Medicine

## 2021-03-25 NOTE — Progress Notes (Signed)
Established Patient Office Visit  Subjective:  Patient ID: Dana Adkins, female    DOB: 1985-07-22  Age: 35 y.o. MRN: [id number]  CC:  Chief Complaint  Patient presents with   Back Pain    Has had the pain a long time per pt says it scolaris     HPI Dana Adkins presents for follow up of migraines. Patient also reports that she has not started meds for her mood as she is med adverse. She has picked up meds however from pharmacy. She has intermittent back pain.   Past Medical History:  Diagnosis Date   Eczema    Obesity (BMI 35.0-39.9 without comorbidity)    Tobacco abuse     Past Surgical History:  Procedure Laterality Date   EYE SURGERY     lasix   WISDOM TOOTH EXTRACTION      Family History  Problem Relation Age of Onset   Liver cancer Father 78   Stomach cancer Neg Hx    Colon cancer Neg Hx    Esophageal cancer Neg Hx    Pancreatic cancer Neg Hx     Social History   Socioeconomic History   Marital status: Married    Spouse name: Not on file   Number of children: Not on file   Years of education: Not on file   Highest education level: Not on file  Occupational History   Not on file  Tobacco Use   Smoking status: Every Day    Packs/day: 1.00    Types: Cigarettes    Start date: 2015   Smokeless tobacco: Never  Vaping Use   Vaping Use: Never used  Substance and Sexual Activity   Alcohol use: Never   Drug use: Never   Sexual activity: Not on file  Other Topics Concern   Not on file  Social History Narrative   Not on file   Social Determinants of Health   Financial Resource Strain: Not on file  Food Insecurity: Not on file  Transportation Needs: Not on file  Physical Activity: Not on file  Stress: Not on file  Social Connections: Not on file  Intimate Partner Violence: Not on file    ROS Review of Systems  Musculoskeletal:  Positive for back pain.  Neurological:  Positive for headaches. Negative for weakness.  All other systems  reviewed and are negative.  Objective:   Today's Vitals: BP 111/75   Pulse 85   Temp 98.8 F (37.1 C)   Resp 20   Ht 5\' 6"  (1.676 m)   Wt 231 lb (104.8 kg)   LMP 03/03/2021 (Approximate)   SpO2 96%   BMI 37.28 kg/m   Physical Exam Vitals and nursing note reviewed.  Constitutional:      General: She is not in acute distress. Cardiovascular:     Rate and Rhythm: Normal rate and regular rhythm.  Pulmonary:     Effort: Pulmonary effort is normal.     Breath sounds: Normal breath sounds.  Musculoskeletal:     Right wrist: Tenderness present. No swelling or deformity. Normal range of motion.     Left wrist: Tenderness present. No swelling or deformity. Normal range of motion.     Lumbar back: Tenderness present.  Neurological:     General: No focal deficit present.     Mental Status: She is alert and oriented to person, place, and time.    Assessment & Plan:   1. Migraine without status migrainosus, not intractable, unspecified  migraine type Some improvement per patient. Will continue present management and monitor  2. Anxiety Discussed patient starting the meds that had been recommended previously.   3. Left-sided low back pain without sciatica, unspecified chronicity Meloxicam prescribed - patient defers referral for further eval/mgt at this time.     Outpatient Encounter Medications as of 03/24/2021  Medication Sig   DUPIXENT 300 MG/2ML SOPN Inject into the skin.   meloxicam (MOBIC) 15 MG tablet Take 1 tablet (15 mg total) by mouth daily.   SUMAtriptan (IMITREX) 100 MG tablet Take 1 tablet (100 mg total) by mouth every 2 (two) hours as needed for migraine. May repeat in 2 hours if headache persists or recurs.   tranexamic acid (LYSTEDA) 650 MG TABS tablet Take by mouth.   triamcinolone cream (KENALOG) 0.1 % Apply 1 application topically 2 (two) times daily.   No facility-administered encounter medications on file as of 03/24/2021.    Follow-up: No follow-ups on  file.   Dana Raymond, MD

## 2021-04-21 IMAGING — CT CT ABD-PELV W/ CM
2 of 4 series · 17 of 46 positions shown, 19 images · IV contrast (omnipaque)
Comparison: None.

CLINICAL DATA: Right abdominal pain

EXAM:
CT ABDOMEN AND PELVIS WITH CONTRAST
TECHNIQUE: Multidetector CT imaging of the abdomen and pelvis was performed
using the standard protocol following bolus administration of
intravenous contrast.
CONTRAST:  100mL OMNIPAQUE IOHEXOL 300 MG/ML  SOLN

[Series 2: axial st · axial · 0.83mm/px · z∈[-451,-56]mm · 14 of 89 slices shown, 16 images]
[im 5/89  soft-tissue]
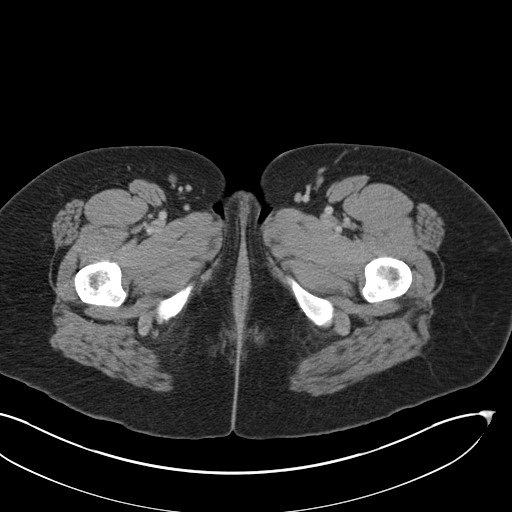
[im 5/89  bone]
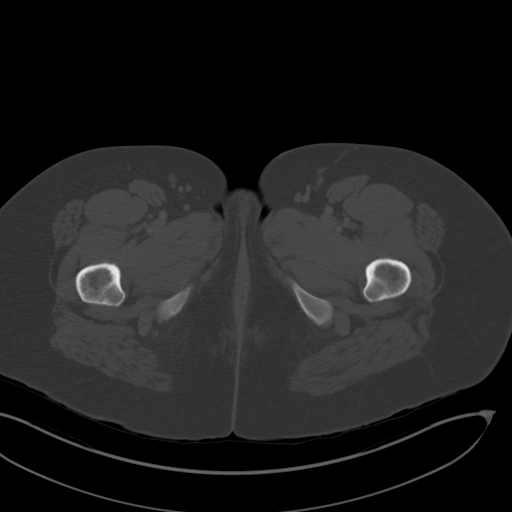
[im 10/89  soft-tissue]
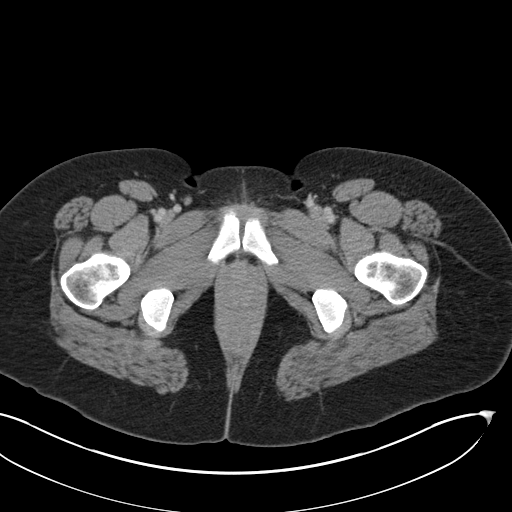
[im 20/89  soft-tissue]
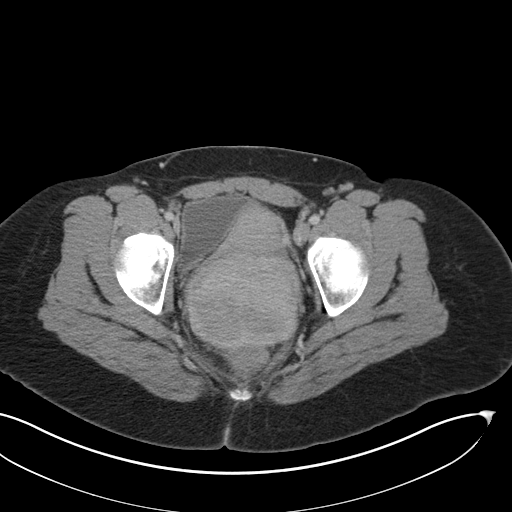
[im 25/89  soft-tissue]
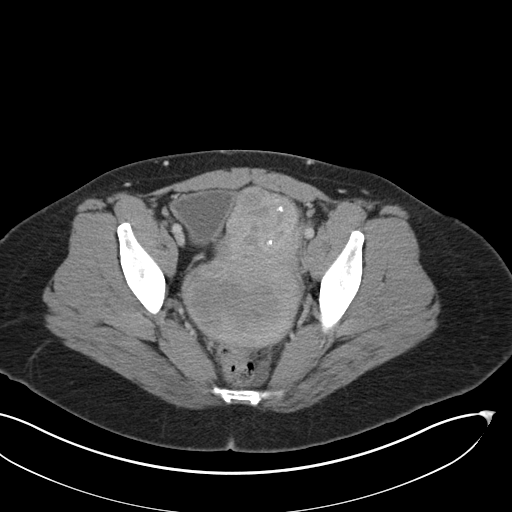
[im 30/89  soft-tissue]
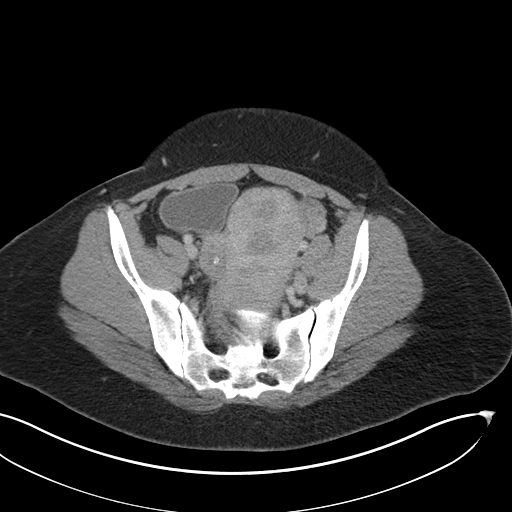
[im 35/89  soft-tissue]
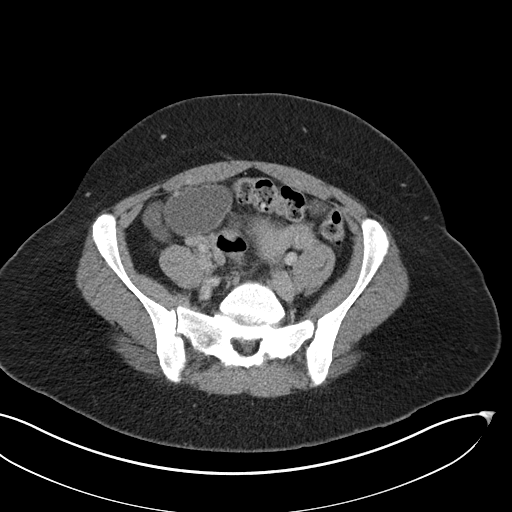
[im 40/89  soft-tissue]
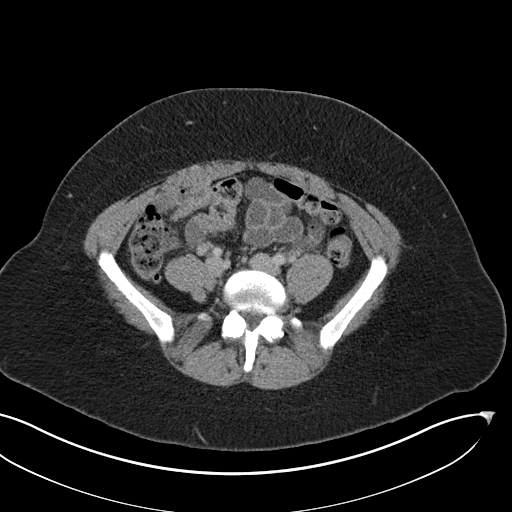
[im 49/89  soft-tissue]
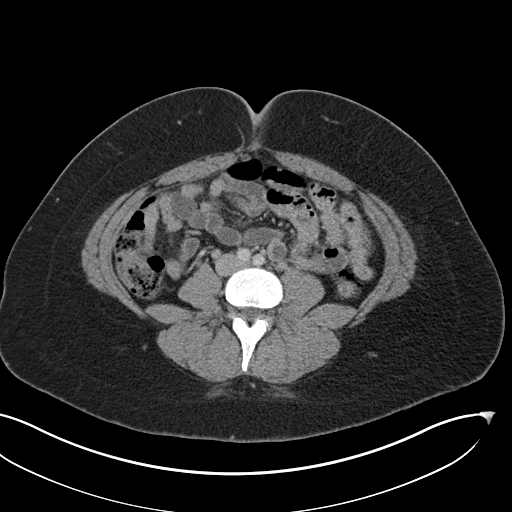
[im 54/89  soft-tissue]
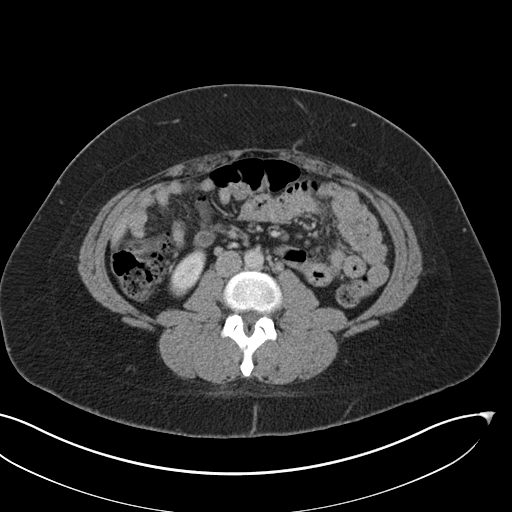
[im 54/89  bone]
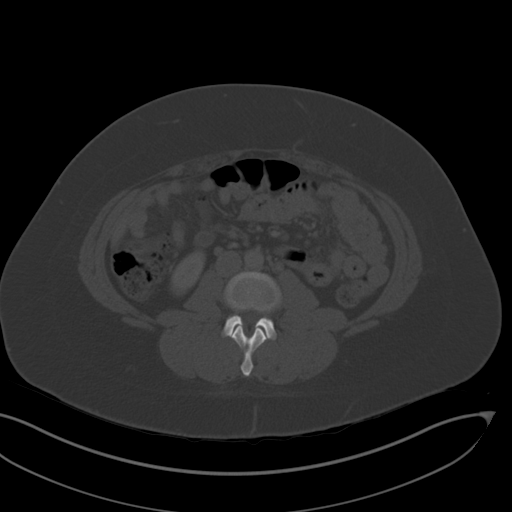
[im 59/89  soft-tissue]
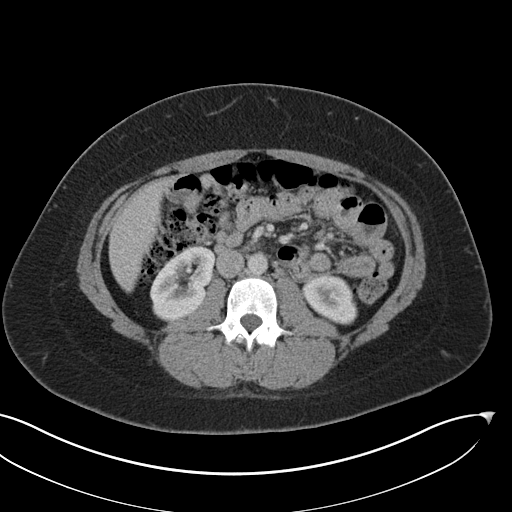
[im 64/89  soft-tissue]
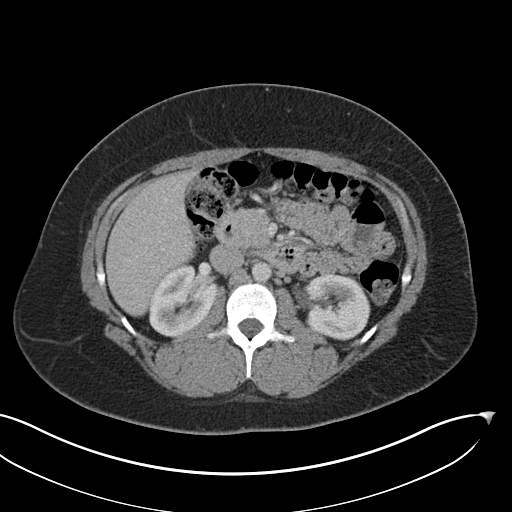
[im 69/89  soft-tissue]
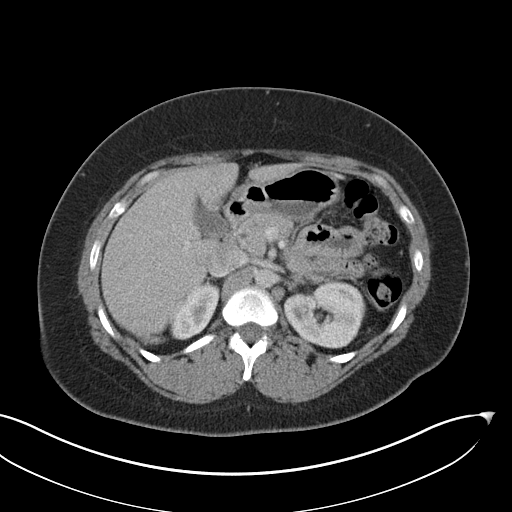
[im 79/89  soft-tissue]
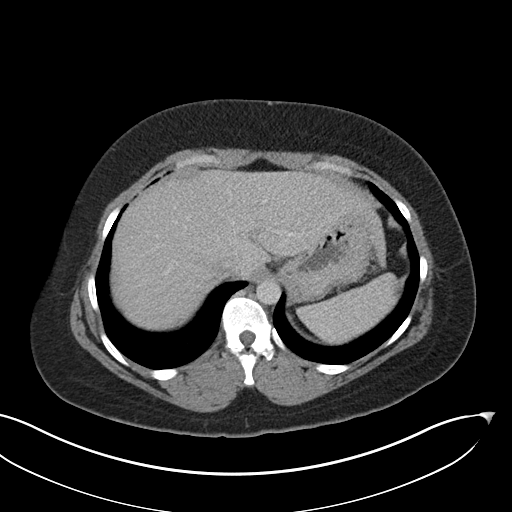
[im 84/89  soft-tissue]
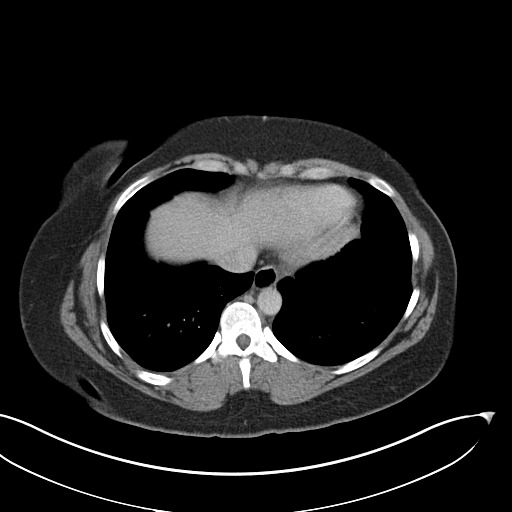

[Series 4: coronal st · coronal · 0.83mm/px · 3 of 151 slices shown]
[im 51/151  soft-tissue]
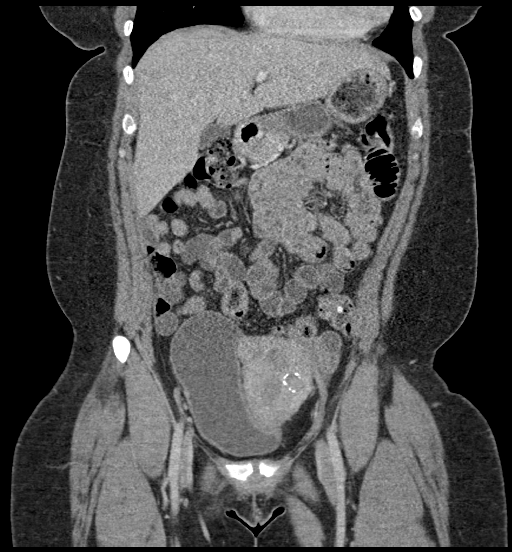
[im 67/151  soft-tissue]
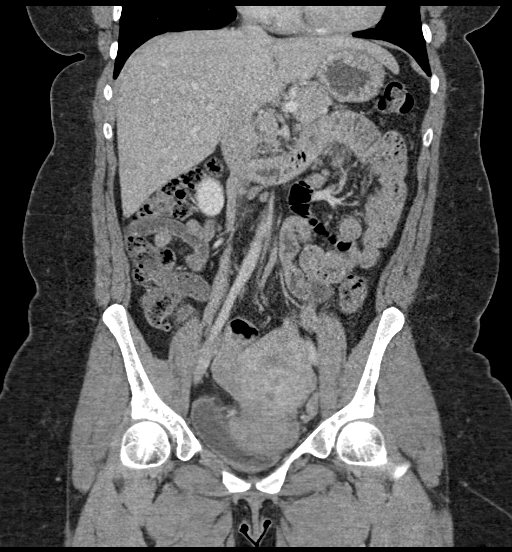
[im 84/151  soft-tissue]
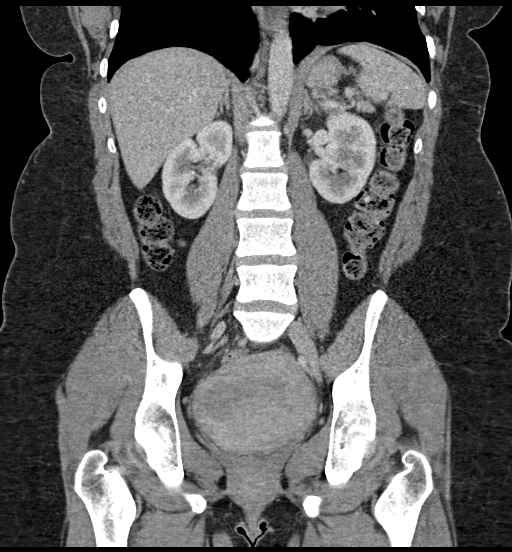

[17 of 46 positions shown; findings below may reference images not displayed]

FINDINGS: Lower chest: No acute abnormality.

Hepatobiliary: No focal liver abnormality is seen. No gallstones,
gallbladder wall thickening, or biliary dilatation.

Pancreas: Unremarkable. No pancreatic ductal dilatation or
surrounding inflammatory changes.

Spleen: Normal in size without focal abnormality.

Adrenals/Urinary Tract: Adrenals and kidneys are unremarkable. The
bladder is partially distended and displaced by uterus.

Stomach/Bowel: Stomach is within normal limits. Bowel is normal in
caliber. Normal appendix.

Vascular/Lymphatic: No significant vascular abnormality. No enlarged
lymph nodes.

Reproductive: Bulky, myomatous uterus.  No adnexal mass.

Other: No ascites.  Abdominal wall is unremarkable.

Musculoskeletal: No significant or acute osseous abnormality.
IMPRESSION: No acute abnormality.  Bulky fibroid uterus.

## 2021-04-21 IMAGING — US US ABDOMEN LIMITED
1 series · 14 of 25 positions shown · non-contrast
Comparison: None.

CLINICAL DATA: Right upper quadrant pain x1 day.

EXAM:
ULTRASOUND ABDOMEN LIMITED RIGHT UPPER QUADRANT

[Series 1: us abdomen limited · 14 of 34 slices shown]
[im 1/34]
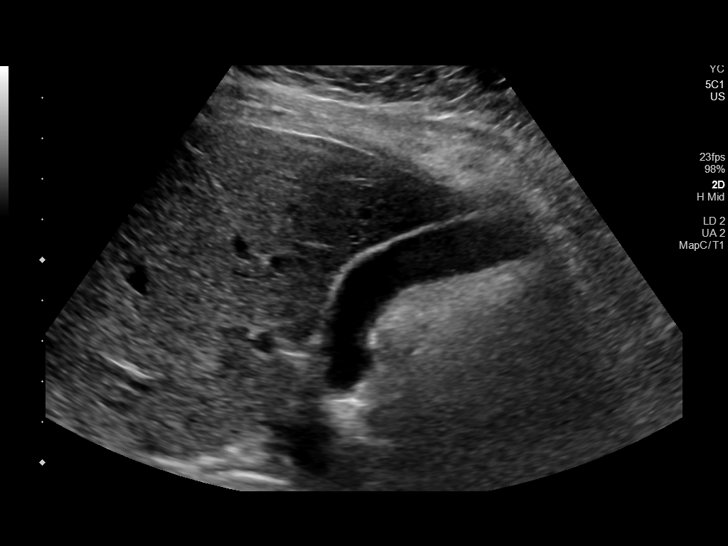
[im 3/34]
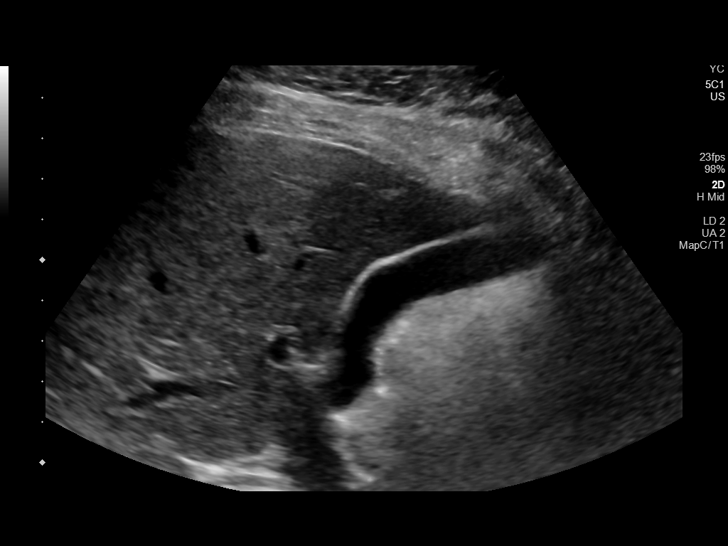
[im 6/34]
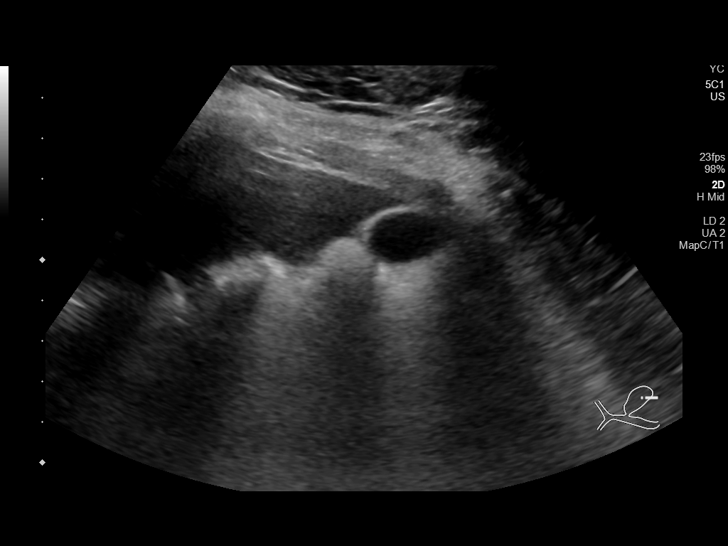
[im 9/34]
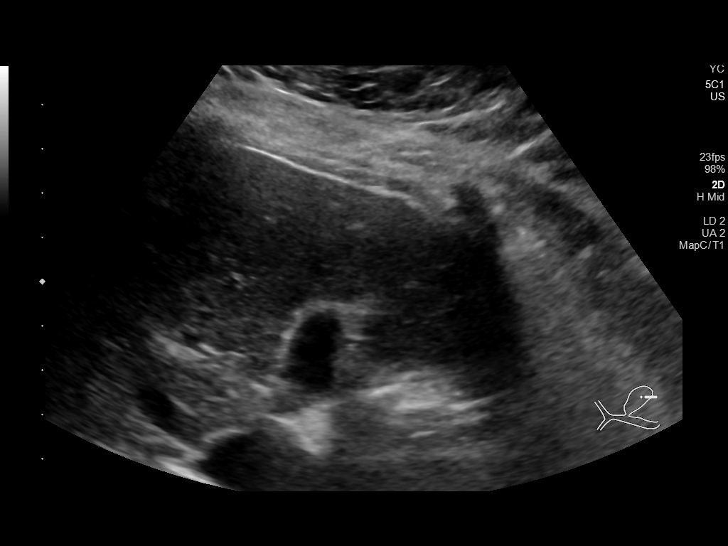
[im 12/34]
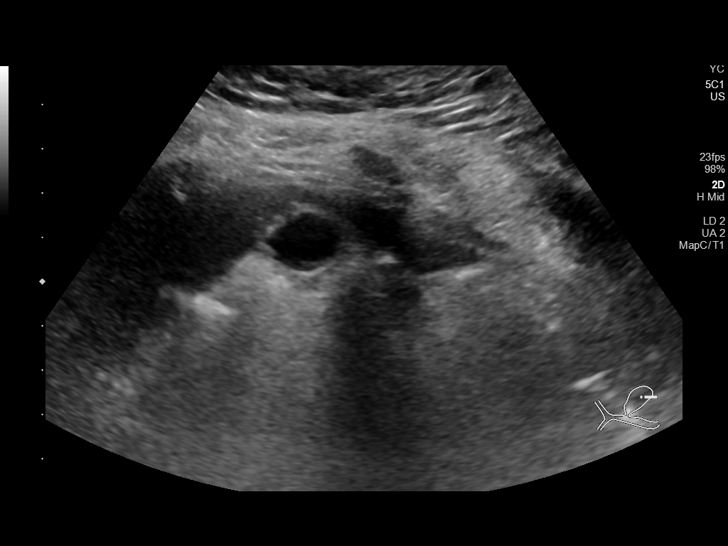
[im 13/34]
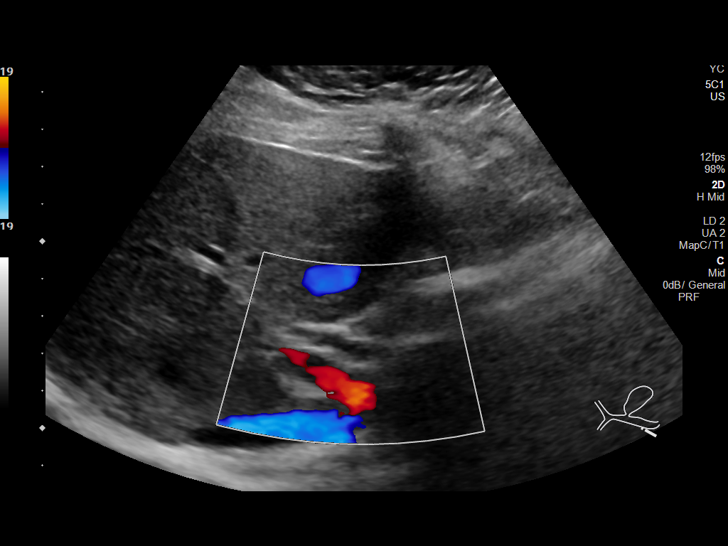
[im 16/34]
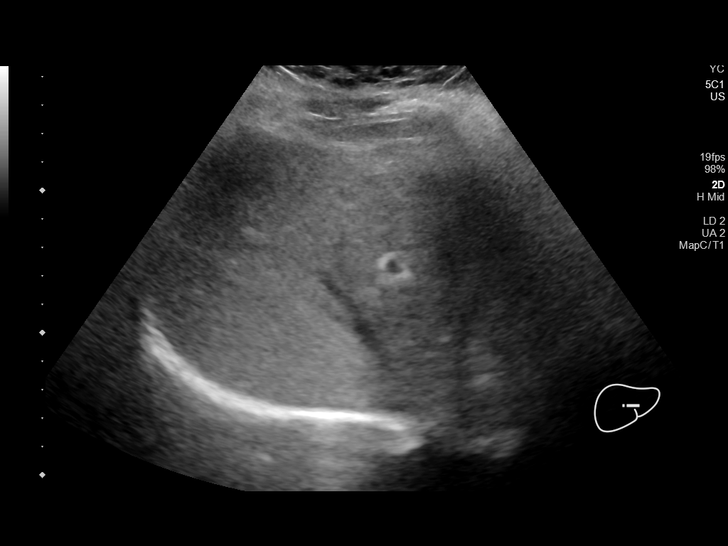
[im 18/34]
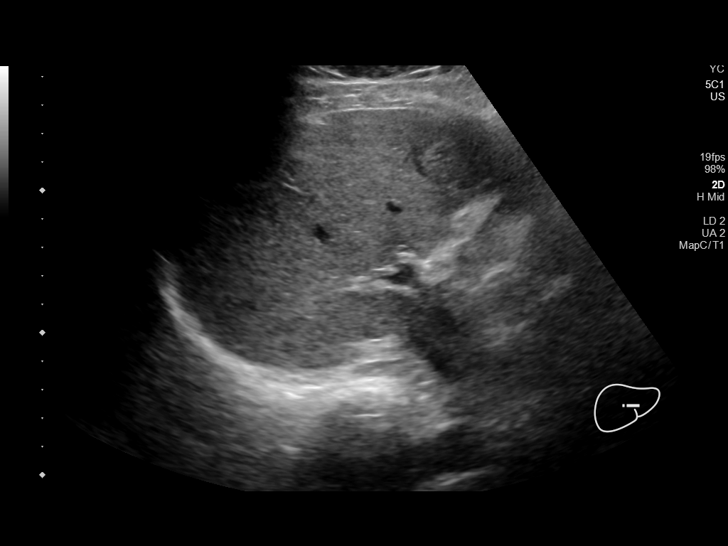
[im 21/34]
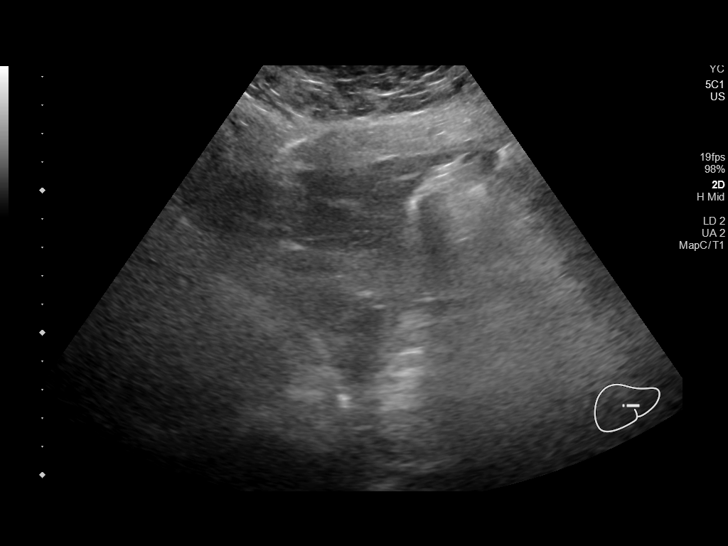
[im 23/34]
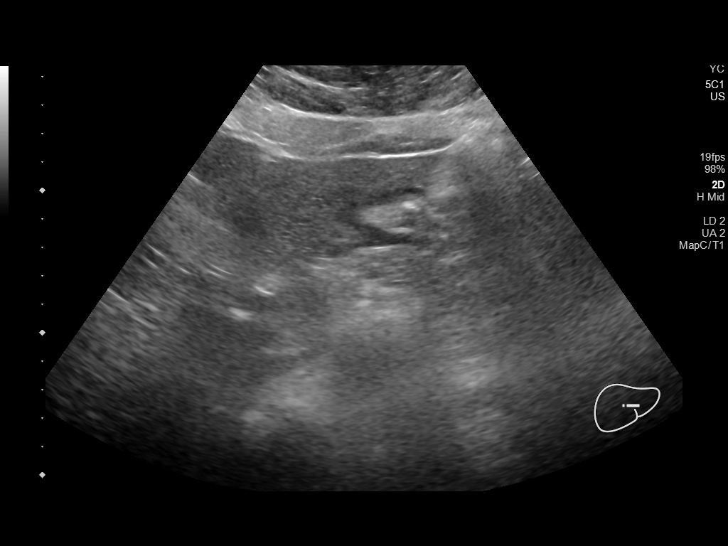
[im 25/34]
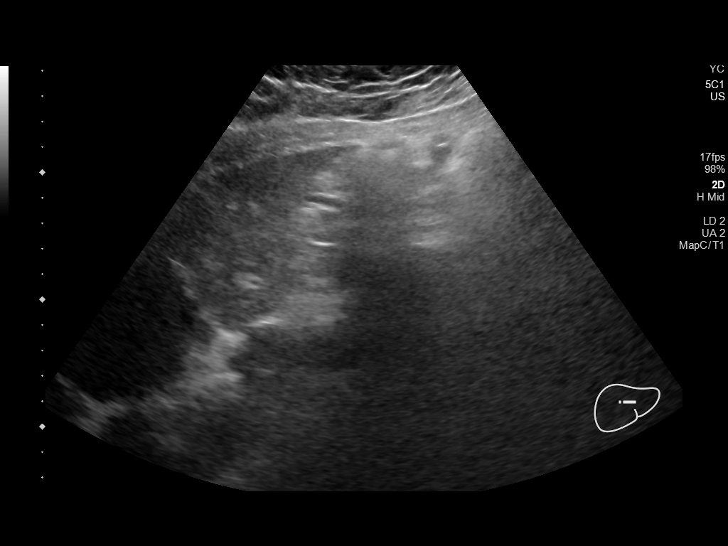
[im 28/34]
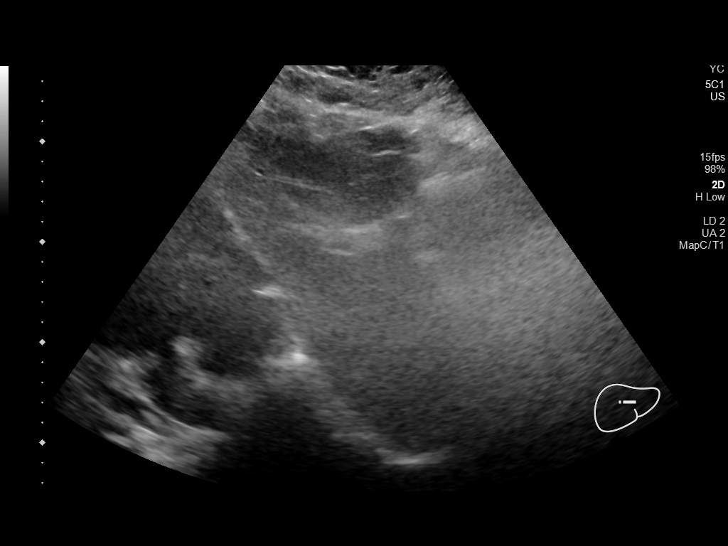
[im 31/34]
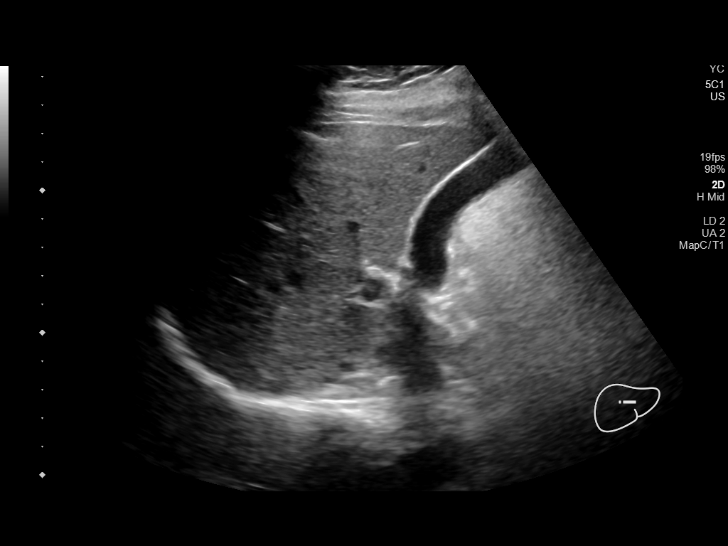
[im 34/34]
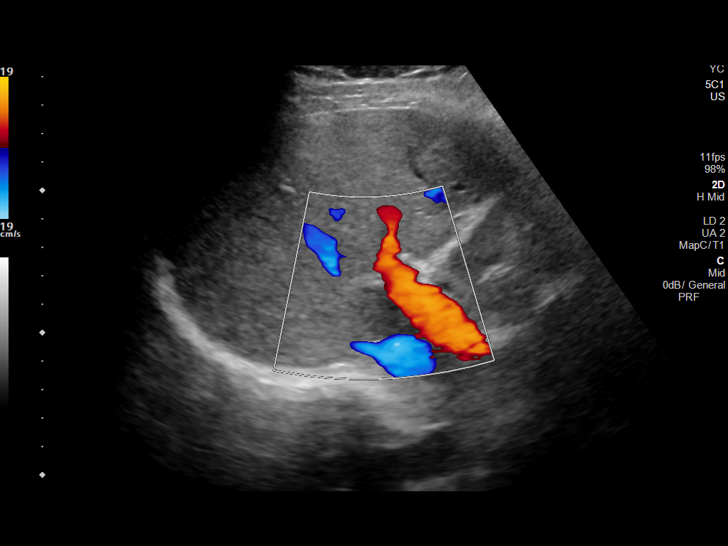

[14 of 25 positions shown; findings below may reference images not displayed]

FINDINGS: Gallbladder:

No gallstones or wall thickening visualized (1.5 mm). No sonographic
Murphy sign noted by sonographer.

Common bile duct:

Diameter: 4.3 mm

Liver:

No focal lesion identified. Within normal limits in parenchymal
echogenicity. Portal vein is patent on color Doppler imaging with
normal direction of blood flow towards the liver.

Other: None.
IMPRESSION: Normal right upper quadrant ultrasound.

## 2021-05-31 ENCOUNTER — Emergency Department (HOSPITAL_COMMUNITY)
Admission: EM | Admit: 2021-05-31 | Discharge: 2021-05-31 | Disposition: A | Discharge status: Home / Self Care | Payer: No Typology Code available for payment source | Attending: Emergency Medicine | Admitting: Emergency Medicine

## 2021-05-31 ENCOUNTER — Other Ambulatory Visit: Payer: Self-pay

## 2021-05-31 ENCOUNTER — Telehealth (HOSPITAL_BASED_OUTPATIENT_CLINIC_OR_DEPARTMENT_OTHER): Payer: Self-pay | Admitting: Emergency Medicine

## 2021-05-31 ENCOUNTER — Emergency Department (HOSPITAL_COMMUNITY): Payer: No Typology Code available for payment source

## 2021-05-31 DIAGNOSIS — R509 Fever, unspecified: Secondary | ICD-10-CM | POA: Diagnosis present

## 2021-05-31 DIAGNOSIS — G8918 Other acute postprocedural pain: Secondary | ICD-10-CM | POA: Diagnosis not present

## 2021-05-31 DIAGNOSIS — Z20822 Contact with and (suspected) exposure to covid-19: Secondary | ICD-10-CM | POA: Insufficient documentation

## 2021-05-31 DIAGNOSIS — R5082 Postprocedural fever: Secondary | ICD-10-CM

## 2021-05-31 DIAGNOSIS — R52 Pain, unspecified: Secondary | ICD-10-CM

## 2021-05-31 LAB — BLOOD CULTURE ID PANEL (REFLEXED) - BCID2

## 2021-05-31 LAB — CBC WITH DIFFERENTIAL/PLATELET
Abs Immature Granulocytes: 0.03 10*3/uL (ref 0.00–0.07)
Basophils Absolute: 0 10*3/uL (ref 0.0–0.1)
Basophils Relative: 0 %
Eosinophils Absolute: 0.2 10*3/uL (ref 0.0–0.5)
Eosinophils Relative: 2 %
HCT: 28.1 % — ABNORMAL LOW (ref 36.0–46.0)
Hemoglobin: 9.4 g/dL — ABNORMAL LOW (ref 12.0–15.0)
Immature Granulocytes: 0 %
Lymphocytes Relative: 17 %
Lymphs Abs: 1.8 10*3/uL (ref 0.7–4.0)
MCH: 28.7 pg (ref 26.0–34.0)
MCHC: 33.5 g/dL (ref 30.0–36.0)
MCV: 85.9 fL (ref 80.0–100.0)
Monocytes Absolute: 0.9 10*3/uL (ref 0.1–1.0)
Monocytes Relative: 8 %
Neutro Abs: 7.7 10*3/uL (ref 1.7–7.7)
Neutrophils Relative %: 73 %
Platelets: 211 10*3/uL (ref 150–400)
RBC: 3.27 MIL/uL — ABNORMAL LOW (ref 3.87–5.11)
RDW: 13.7 % (ref 11.5–15.5)
WBC: 10.7 10*3/uL — ABNORMAL HIGH (ref 4.0–10.5)
nRBC: 0 % (ref 0.0–0.2)

## 2021-05-31 LAB — URINALYSIS, ROUTINE W REFLEX MICROSCOPIC
Bilirubin Urine: NEGATIVE
Glucose, UA: NEGATIVE mg/dL
Ketones, ur: NEGATIVE mg/dL
Nitrite: NEGATIVE
Protein, ur: NEGATIVE mg/dL
Specific Gravity, Urine: 1.01 (ref 1.005–1.030)
pH: 6 (ref 5.0–8.0)

## 2021-05-31 LAB — COMPREHENSIVE METABOLIC PANEL
ALT: 58 U/L — ABNORMAL HIGH (ref 0–44)
AST: 88 U/L — ABNORMAL HIGH (ref 15–41)
Albumin: 2.9 g/dL — ABNORMAL LOW (ref 3.5–5.0)
Alkaline Phosphatase: 77 U/L (ref 38–126)
Anion gap: 9 (ref 5–15)
BUN: 6 mg/dL (ref 6–20)
CO2: 23 mmol/L (ref 22–32)
Calcium: 8.3 mg/dL — ABNORMAL LOW (ref 8.9–10.3)
Chloride: 100 mmol/L (ref 98–111)
Creatinine, Ser: 0.94 mg/dL (ref 0.44–1.00)
GFR, Estimated: >60 mL/min (ref >60)
Glucose, Bld: 109 mg/dL — ABNORMAL HIGH (ref 70–99)
Potassium: 3.6 mmol/L (ref 3.5–5.1)
Sodium: 132 mmol/L — ABNORMAL LOW (ref 135–145)
Total Bilirubin: 0.9 mg/dL (ref 0.3–1.2)
Total Protein: 6.4 g/dL — ABNORMAL LOW (ref 6.5–8.1)

## 2021-05-31 LAB — RESP PANEL BY RT-PCR (FLU A&B, COVID) ARPGX2
Influenza A by PCR: NEGATIVE
Influenza B by PCR: NEGATIVE
SARS Coronavirus 2 by RT PCR: NEGATIVE

## 2021-05-31 LAB — APTT: aPTT: 32 seconds (ref 24–36)

## 2021-05-31 LAB — LACTIC ACID, PLASMA: Lactic Acid, Venous: 1.2 mmol/L (ref 0.5–1.9)

## 2021-05-31 LAB — PROTIME-INR
INR: 1 (ref 0.8–1.2)
Prothrombin Time: 13.3 seconds (ref 11.4–15.2)

## 2021-05-31 LAB — I-STAT BETA HCG BLOOD, ED (MC, WL, AP ONLY): I-stat hCG, quantitative: 15.5 m[IU]/mL — ABNORMAL HIGH (ref <5)

## 2021-05-31 MED ORDER — IBUPROFEN 800 MG PO TABS
800.0000 mg | ORAL_TABLET | Freq: Once | ORAL | Status: AC
  Administered 2021-05-31: 800 mg via ORAL
  Filled 2021-05-31: qty 1

## 2021-05-31 NOTE — Discharge Instructions (Signed)
If you have any worsened pain in your abdomen, fever above 100.4 or any vomiting in the next 4-6 hours please return to the ER for an exam and CAT scan

## 2021-05-31 NOTE — ED Provider Triage Note (Signed)
Emergency Medicine Provider Triage Evaluation Note  Dana Adkins , a 36 y.o. female  was evaluated in triage.  Pt complains of fever.  She is status post open myomectomy for uterine fibroids this past Tuesday.  Today she began feeling very terrible she noted having a fever and was told to come to the emergency department she denies any significant increase in pain in her uterus.  She has no difficulty urinating.  She feels extremely lightheaded and weak.  Review of Systems  Positive: Fever Negative: Difficulty with urination  Physical Exam  BP 137/85    Pulse (!) 117    Temp (!) 100.9 F (38.3 C) (Oral)    Resp 16    SpO2 98%  Gen:   Awake, no distress   Resp:  Normal effort  MSK:   Moves extremities without difficulty  Other:  Well-healing suprapubic surgical incision without evidence of infection.  Distended firm abdomen superior to the umbilicus.  Tender to palpation  Medical Decision Making  Medically screening exam initiated at 12:32 AM.  Appropriate orders placed.  HILDUR BAYER was informed that the remainder of the evaluation will be completed by another provider, this initial triage assessment does not replace that evaluation, and the importance of remaining in the ED until their evaluation is complete.  Patient febrile and tachycardic after recent open myomectomy.  She has exquisite tenderness to her abdomen.  I have ordered labs and work-up.   Arthor Captain, PA-C 05/31/21 [phone]

## 2021-05-31 NOTE — ED Provider Notes (Signed)
[facility] EMERGENCY DEPARTMENT Provider Note   CSN: [id number] Arrival date & time: 05/31/21  0003     History  Chief Complaint  Patient presents with   Post-op Problem    Dana Adkins is a 36 y.o. female.  The history is provided by the patient and the spouse.  Fever Severity:  Moderate Onset quality:  Gradual Chronicity:  New Worsened by:  Nothing Associated symptoms: chills and headaches   Associated symptoms: no chest pain, no cough, no diarrhea, no dysuria and no vomiting   Patient presents with postoperative fever.  Patient reports she went uterine myomectomy on February 7. No complications reported, but she does report she had a fever upon discharge.  Since that time she has had intermittent fevers.  The fever worsened tonight and she has been having lower abdominal pain. No vomiting or diarrhea.  No dysuria.  No change in vaginal discharge.  She has had mild headache that is improved.  No significant upper respiratory symptoms   Past Medical History:  Diagnosis Date   Eczema    Obesity (BMI 35.0-39.9 without comorbidity)    Tobacco abuse     Home Medications Prior to Admission medications   Medication Sig Start Date End Date Taking? Authorizing Provider  DUPIXENT 300 MG/2ML SOPN Inject into the skin. 11/19/20   [provider]  meloxicam (MOBIC) 15 MG tablet Take 1 tablet (15 mg total) by mouth daily. 03/24/21   Georganna Skeans, MD  SUMAtriptan (IMITREX) 100 MG tablet Take 1 tablet (100 mg total) by mouth every 2 (two) hours as needed for migraine. May repeat in 2 hours if headache persists or recurs. 02/10/21   Georganna Skeans, MD  tranexamic acid (LYSTEDA) 650 MG TABS tablet Take by mouth. 01/21/21   [provider]  triamcinolone cream (KENALOG) 0.1 % Apply 1 application topically 2 (two) times daily. 07/31/20   Philip Aspen, Limmie Patricia, MD      Allergies    Patient has no known allergies.    Review of Systems   Review  of Systems  Constitutional:  Positive for chills and fever.  Respiratory:  Negative for cough.   Cardiovascular:  Negative for chest pain.  Gastrointestinal:  Negative for diarrhea and vomiting.  Genitourinary:  Negative for dysuria and vaginal discharge.  Neurological:  Positive for headaches.   Physical Exam Updated Vital Signs BP 112/86 (BP Location: Right Arm)    Pulse 92    Temp 99.8 F (37.7 C) (Oral)    Resp 16    Ht 1.676 m (5\' 6" )    SpO2 100%    BMI 37.28 kg/m  Physical Exam CONSTITUTIONAL: Well developed/well nourished HEAD: Normocephalic/atraumatic EYES: EOMI/PERRL ENMT: Mucous membranes moist, uvula midline with no erythema or exudates NECK: supple no meningeal signs SPINE/BACK:entire spine nontender CV: S1/S2 noted, no murmurs/rubs/gallops noted LUNGS: Lungs are clear to auscultation bilaterally, no apparent distress ABDOMEN: soft, mild diffuse lower abdominal tenderness. GU:no cva tenderness NEURO: Pt is awake/alert/appropriate, moves all extremitiesx4.  No facial droop.   EXTREMITIES: pulses normal/equal, full ROM SKIN: warm, color normal PSYCH: no abnormalities of mood noted, alert and oriented to situation  ED Results / Procedures / Treatments   Labs (all labs ordered are listed, but only abnormal results are displayed) Labs Reviewed  COMPREHENSIVE METABOLIC PANEL - Abnormal; Notable for the following components:      Result Value   Sodium 132 (*)    Glucose, Bld 109 (*)    Calcium  8.3 (*)    Total Protein 6.4 (*)    Albumin 2.9 (*)    AST 88 (*)    ALT 58 (*)    All other components within normal limits  CBC WITH DIFFERENTIAL/PLATELET - Abnormal; Notable for the following components:   WBC 10.7 (*)    RBC 3.27 (*)    Hemoglobin 9.4 (*)    HCT 28.1 (*)    All other components within normal limits  URINALYSIS, ROUTINE W REFLEX MICROSCOPIC - Abnormal; Notable for the following components:   Hgb urine dipstick MODERATE (*)    Leukocytes,Ua TRACE (*)     Bacteria, UA RARE (*)    All other components within normal limits  I-STAT BETA HCG BLOOD, ED ([facility], [facility], [facility] ONLY) - Abnormal; Notable for the following components:   I-stat hCG, quantitative 15.5 (*)    All other components within normal limits  CULTURE, BLOOD (ROUTINE X 2)  CULTURE, BLOOD (ROUTINE X 2)  RESP PANEL BY RT-PCR (FLU A&B, COVID) ARPGX2  LACTIC ACID, PLASMA  PROTIME-INR  APTT  HCG, QUANTITATIVE, PREGNANCY    EKG EKG Interpretation  Date/Time:  Saturday May 31 2021 00:22:57 EST Ventricular Rate:  122 PR Interval:  132 QRS Duration: 78 QT Interval:  290 QTC Calculation: 413 R Axis:   71 Text Interpretation: Sinus tachycardia Cannot rule out Anterior infarct , age undetermined T wave abnormality, consider inferolateral ischemia Abnormal ECG No previous ECGs available Confirmed by Zadie Rhine ([id number]) on 05/31/2021 2:55:52 AM  Radiology DG Chest 1 View  Result Date: 05/31/2021 CLINICAL DATA:  Lower abdominal pain. EXAM: CHEST  1 VIEW COMPARISON:  None. FINDINGS: No focal consolidation, pleural effusion, or pneumothorax. The cardiac silhouette is within limits. No acute osseous pathology. IMPRESSION: No active disease. Electronically Signed   By: Elgie Collard M.D.   On: 05/31/2021 01:09    Procedures Procedures    Medications Ordered in ED Medications  ibuprofen (ADVIL) tablet 800 mg (800 mg Oral Given 05/31/21 0114)    ED Course/ Medical Decision Making/ A&P                           Medical Decision Making  This patient presents to the ED for concern of abdominal pain, this involves an extensive number of treatment options, and is a complaint that carries with it a high risk of complications and morbidity.  The differential diagnosis includes postoperative abscess, postoperative infection, urinary tract infection, appendicitis, diverticulitis  Comorbidities that complicate the patient evaluation: Patients presentation is complicated by their  history of recent surgery  Social Determinants of Health: Patients impaired access to primary care and [facility] increases the complexity of managing their presentation  Additional history obtained: Additional history obtained from spouse   Lab Tests: I Ordered, and personally interpreted labs.  The pertinent results include: Leukocytosis  Imaging Studies ordered: I ordered imaging studies including X-ray chest I independently visualized and interpreted imaging which showed no acute findings I agree with the radiologist interpretation  Cardiac Monitoring: The patient was maintained on a cardiac monitor.  I personally viewed and interpreted the cardiac monitor which showed an underlying rhythm of:  sinus rhythm   Complexity of problems addressed: Patients presentation is most consistent with  acute presentation with potential threat to life or bodily function      Disposition: After consideration of the diagnostic results and the patients response to treatment,  I  feel that the patent would benefit from discharge .   Patient presented with abdominal pain and fever several days after myomectomy. Overall patient is very well-appearing.  Patient had mild anemia though  suspect this is her baseline, mild leukocytosis.  Her only true symptoms were fever and abdominal pain which she reports is similar to pain after the surgery However given the high risk nature of this illness, I advised CT imaging.  Patient declines further work-up at this time. She does not want to wait for CT imaging.  We discussed the risk and benefits of going home without further work-up and she still insisted on leaving Discussed at length strict ER return precautions with patient and her spouse         Final Clinical Impression(s) / ED Diagnoses Final diagnoses:  Post-operative pain  Post-procedural fever    Rx / DC Orders ED Discharge Orders     None          Zadie Rhine, MD 05/31/21 [id number]

## 2021-05-31 NOTE — ED Triage Notes (Signed)
Pt reported to ED with c/o pain to abdomen and fever after having myomectomy on Tuesday for uterine fibroids.

## 2021-06-03 LAB — CULTURE, BLOOD (ROUTINE X 2): Special Requests: ADEQUATE

## 2021-09-22 ENCOUNTER — Encounter: Payer: Self-pay | Admitting: Neurology

## 2022-01-14 ENCOUNTER — Ambulatory Visit: Payer: No Typology Code available for payment source | Admitting: Internal Medicine

## 2022-02-11 NOTE — Progress Notes (Signed)
NEUROLOGY CONSULTATION NOTE  Dana Adkins MRN: [id number] DOB: 1985-10-29  Referring provider: Fortunato Curling, MD Primary care provider: Fortunato Curling, MD  Reason for consult:  migraine  Assessment/Plan:   Migraine with aura, without status migrainosus, not intractable  She is currently trying to get pregnant, so we would like to avoid starting a preventative medication. Migraine prevention:  lifestyle modification - supplements (magnesium oxide 400mg  daily, riboflavin 400mg  daily), routine exercise, proper sleep hygiene, diet Migraine rescue:  stop sumatriptan.  She will try rizatriptan 10mg  Limit use of pain relievers to no more than 2 days out of week to prevent risk of rebound or medication-overuse headache. Keep headache diary Follow up 4-5 months.    Subjective:  Dana Adkins is a 36 year old female who presents for migraines.  History supplemented by primary care notes.  Onset:  36 years old Location:  right (sometimes left) retro-orbital and can radiate to back of head Quality:  pounding/pulsating Intensity:  severe.   Aura:  white light in her vision preceding headache Prodrome:  absent Associated symptoms:  photophobia.  Sometimes nausea and phonophobia.  She denies associated unilateral numbness or weakness. Duration:  6 hours  Frequency:  once a week Frequency of abortive medication: once a week Triggers:  salty foods, pork Relieving factors:  laying down in rest Activity:  aggravates  Currently taking fertility medications.    Past NSAIDS/analgesics:  ibuprofen, Tylenol, Excedrin Past abortive triptans:  none Past abortive ergotamine:  none Past muscle relaxants:  none Past anti-emetic:  none Past antihypertensive medications:  none Past antidepressant medications:  none Past anticonvulsant medications:  none Past anti-CGRP:  none Past vitamins/Herbal/Supplements:  none Past antihistamines/decongestants:  none Other past therapies:   none  Current NSAIDS/analgesics:  meloxicam Current triptans:  sumatriptan 100mg  Current ergotamine:  none Current anti-emetic:  none Current muscle relaxants:  none Current Antihypertensive medications:  none Current Antidepressant medications:  none Current Anticonvulsant medications:  none Current anti-CGRP:  none Current Vitamins/Herbal/Supplements:  prenatals Current Antihistamines/Decongestants:  none Other therapy:  none Birth control:  none    Caffeine:  occasional Pepsi (twice a week).  No coffee Alcohol:  no Smoker:  just quit 3 months ago Diet:  over 1/2 gallon water daily.  Skips meals Exercise:  walks  Depression:  yes; Anxiety:  yes Other pain:  back pain Sleep hygiene:  trouble falling asleep.  Sometimes difficulty staying asleep.  Avg 4-6 hours sleep a night. Family history of headache:  no      PAST MEDICAL HISTORY: Past Medical History:  Diagnosis Date   Eczema    Obesity (BMI 35.0-39.9 without comorbidity)    Tobacco abuse     PAST SURGICAL HISTORY: Past Surgical History:  Procedure Laterality Date   EYE SURGERY     lasix   WISDOM TOOTH EXTRACTION      MEDICATIONS: Current Outpatient Medications on File Prior to Visit  Medication Sig Dispense Refill   DUPIXENT 300 MG/2ML SOPN Inject into the skin.     meloxicam (MOBIC) 15 MG tablet Take 1 tablet (15 mg total) by mouth daily. 30 tablet 3   SUMAtriptan (IMITREX) 100 MG tablet Take 1 tablet (100 mg total) by mouth every 2 (two) hours as needed for migraine. May repeat in 2 hours if headache persists or recurs. 10 tablet 0   tranexamic acid (LYSTEDA) 650 MG TABS tablet Take by mouth.     triamcinolone cream (KENALOG) 0.1 % Apply 1 application topically 2 (two)  times daily. 453.6 g 2   No current facility-administered medications on file prior to visit.    ALLERGIES: No Known Allergies  FAMILY HISTORY: Family History  Problem Relation Age of Onset   Liver cancer Father 64   Stomach  cancer Neg Hx    Colon cancer Neg Hx    Esophageal cancer Neg Hx    Pancreatic cancer Neg Hx     Objective:  Blood pressure 115/66, pulse 85, height 5\' 6"  (1.676 m), weight 242 lb 3.2 oz (109.9 kg), SpO2 98 %. General: No acute distress.  Patient appears well-groomed.   Head:  Normocephalic/atraumatic Eyes:  fundi examined but not visualized Neck: supple, no paraspinal tenderness, full range of motion Back: No paraspinal tenderness Heart: regular rate and rhythm Lungs: Clear to auscultation bilaterally. Vascular: No carotid bruits. Neurological Exam: Mental status: alert and oriented to person, place, and time, speech fluent and not dysarthric, language intact. Cranial nerves: CN I: not tested CN II: pupils equal, round and reactive to light, visual fields intact CN III, IV, VI:  full range of motion, no nystagmus, no ptosis CN V: facial sensation intact. CN VII: upper and lower face symmetric CN VIII: hearing intact CN IX, X: gag intact, uvula midline CN XI: sternocleidomastoid and trapezius muscles intact CN XII: tongue midline Bulk & Tone: normal, no fasciculations. Motor:  muscle strength 5/5 throughout Sensation:  Pinprick, temperature and vibratory sensation intact. Deep Tendon Reflexes:  2+ throughout,  toes downgoing.   Finger to nose testing:  Without dysmetria.   Heel to shin:  Without dysmetria.   Gait:  Normal station and stride.  Romberg negative.    Thank you for allowing me to take part in the care of this patient.  Metta Clines, DO  CC: Fortunato Curling, MD

## 2022-02-16 ENCOUNTER — Ambulatory Visit (INDEPENDENT_AMBULATORY_CARE_PROVIDER_SITE_OTHER): Payer: No Typology Code available for payment source | Admitting: Neurology

## 2022-02-16 ENCOUNTER — Encounter: Payer: Self-pay | Admitting: Neurology

## 2022-02-16 VITALS — BP 115/66 | HR 85 | Ht 66.0 in | Wt 242.2 lb

## 2022-02-16 DIAGNOSIS — G43109 Migraine with aura, not intractable, without status migrainosus: Secondary | ICD-10-CM | POA: Diagnosis not present

## 2022-02-16 MED ORDER — RIZATRIPTAN BENZOATE 10 MG PO TABS: 10.0000 mg | ORAL_TABLET | ORAL | 5 refills | Status: DC | PRN

## 2022-02-16 NOTE — Patient Instructions (Signed)
  Stop sumatriptan.  Take rizatriptan at earliest onset of headache.  May repeat dose once in 2 hours if needed.  Maximum 2 tablets in 24 hours. Limit use of pain relievers to no more than 2 days out of the week.  These medications include acetaminophen, NSAIDs (ibuprofen/Advil/Motrin, naproxen/Aleve, triptans (Imitrex/sumatriptan), Excedrin, and narcotics.  This will help reduce risk of rebound headaches. Be aware of common food triggers:  - Caffeine:  coffee, black tea, cola, Mt. Dew  - Chocolate  - Dairy:  aged cheeses (brie, blue, cheddar, gouda, [place], provolone, [place], Swiss, etc), chocolate milk, buttermilk, sour cream, limit eggs and yogurt  - Nuts, peanut butter  - Alcohol  - Cereals/grains:  FRESH breads (fresh bagels, sourdough, doughnuts), yeast productions  - Processed/canned/aged/cured meats (pre-packaged deli meats, hotdogs)  - MSG/glutamate:  soy sauce, flavor enhancer, pickled/preserved/marinated foods  - Sweeteners:  aspartame (Equal, Nutrasweet).  Sugar and Splenda are okay  - Vegetables:  legumes (lima beans, lentils, snow peas, fava beans, pinto peans, peas, garbanzo beans), sauerkraut, onions, olives, pickles  - Fruit:  avocados, bananas, citrus fruit (orange, lemon, grapefruit), mango  - Other:  Frozen meals, macaroni and cheese Routine exercise Stay adequately hydrated (aim for 64 oz water daily) Keep headache diary Maintain proper stress management Maintain proper sleep hygiene Do not skip meals Consider supplements:  magnesium oxide 400mg  daily, riboflavin 400mg  daily Follow up 4-5 months.

## 2022-08-14 NOTE — Progress Notes (Signed)
Referral for additional visit sent to the [facility]

## 2022-08-17 NOTE — Progress Notes (Unsigned)
NEUROLOGY FOLLOW UP OFFICE NOTE  Dana Adkins [id number]  Assessment/Plan:   Migraine with aura, without status migrainosus, not intractable Bilateral carpal tunnel syndrome    Migraine prevention:  magnesium Migraine rescue:  rizatriptan Limit use of pain relievers to no more than 2 days out of week to prevent risk of rebound or medication-overuse headache. Keep headache diary NCV-EMG bilateral upper extremities Follow up 6 months.       Subjective:  Dana Adkins is a 37 year old female who follows up for migraine.  UPDATE: She has put off IVF for now.   Added magnesium Tries to limit screen time Rizatriptan works better than sumatriptan Intensity:  5/10-8/10 Duration:  2 hours if rizatriptan taken quickly, otherwise sleeps for up to 6 hours and wakes up without headache Frequency:  3 a month  Since she was in the service 10 years ago, she has suffered from carpal tunnel syndrome.  She has used splints.  Hands are numb and feel weak.  Current NSAIDS/analgesics:  none Current triptans:  rizatriptan 10mg  Current ergotamine:  none Current anti-emetic:  none Current muscle relaxants:  none Current Antihypertensive medications:  none Current Antidepressant medications:  none Current Anticonvulsant medications:  none Current anti-CGRP:  none Current Vitamins/Herbal/Supplements:  magnesium Current Antihistamines/Decongestants:  none Other therapy:  none Birth control:  none     Caffeine:  occasional Pepsi (twice a week).  No coffee Alcohol:  no Smoker:  just quit 3 months ago Diet:  over 1/2 gallon water daily.  Skips meals Exercise:  walks  Depression:  yes; Anxiety:  yes Other pain:  back pain Sleep hygiene:  trouble falling asleep.  Sometimes difficulty staying asleep.  Avg 4-6 hours sleep a night.  HISTORY:  Onset:  37 years old Location:  right (sometimes left) retro-orbital and can radiate to back of head Quality:  pounding/pulsating Intensity:   severe.   Aura:  white light in her vision preceding headache Prodrome:  absent Associated symptoms:  photophobia.  Sometimes nausea and phonophobia.  She denies associated unilateral numbness or weakness. Duration:  6 hours  Frequency:  once a week Frequency of abortive medication: once a week Triggers:  salty foods, pork Relieving factors:  laying down in rest Activity:  aggravates   Currently taking fertility medications.     Past NSAIDS/analgesics:  ibuprofen, Tylenol, Excedrin, meloxicam Past abortive triptans:  none Past abortive ergotamine:  none Past muscle relaxants:  none Past anti-emetic:  none Past antihypertensive medications:  none Past antidepressant medications:  none Past anticonvulsant medications:  none Past anti-CGRP:  none Past vitamins/Herbal/Supplements:  none Past antihistamines/decongestants:  none Other past therapies:  none    Family history of headache:  no  PAST MEDICAL HISTORY: Past Medical History:  Diagnosis Date   Asthma    Eczema    Obesity (BMI 35.0-39.9 without comorbidity)    Plantar fasciitis, bilateral    Tobacco abuse     MEDICATIONS: Current Outpatient Medications on File Prior to Visit  Medication Sig Dispense Refill   Choriogonadotropin Alfa 250 MCG/0.5ML injection INJECT (1 PREFILLED SYRINGE) SUBCUTANEOUSLY ONCE ON DAY 14 AS DIRECTED *COUNT FROM LAST MENSTRUAL PERIOD*     DUPIXENT 300 MG/2ML SOPN Inject into the skin.     Prenatal Vit-Fe Fumarate-FA (MULTIVITAMIN-PRENATAL) 27-0.8 MG TABS tablet Take 1 tablet by mouth daily at 12 noon.     progesterone (PROMETRIUM) 200 MG capsule Place 200 mg vaginally 2 (two) times daily.     rizatriptan (MAXALT)  10 MG tablet Take 1 tablet (10 mg total) by mouth as needed for migraine. May repeat in 2 hours if needed.  Maximum 2 tablets in 24 hours. 10 tablet 5   triamcinolone cream (KENALOG) 0.1 % Apply 1 application topically 2 (two) times daily. 453.6 g 2   No current  facility-administered medications on file prior to visit.    ALLERGIES: No Known Allergies  FAMILY HISTORY: Family History  Problem Relation Age of Onset   Liver cancer Father 39   Stomach cancer Neg Hx    Colon cancer Neg Hx    Esophageal cancer Neg Hx    Pancreatic cancer Neg Hx       Objective:  Blood pressure 105/64, pulse 70, height 5\' 6"  (1.676 m), weight 184 lb 6.4 oz (83.6 kg), SpO2 98 %. General: No acute distress.  Patient appears well-groomed.    Neurological Exam: Muscle strength 5/5.  Sensation intact.  Tinel's negative.  Phalen's positive.   Shon Millet, DO

## 2022-08-18 ENCOUNTER — Ambulatory Visit (INDEPENDENT_AMBULATORY_CARE_PROVIDER_SITE_OTHER): Payer: No Typology Code available for payment source | Admitting: Neurology

## 2022-08-18 ENCOUNTER — Encounter: Payer: Self-pay | Admitting: Neurology

## 2022-08-18 VITALS — BP 105/64 | HR 70 | Ht 66.0 in | Wt 184.4 lb

## 2022-08-18 DIAGNOSIS — G43109 Migraine with aura, not intractable, without status migrainosus: Secondary | ICD-10-CM

## 2022-08-18 DIAGNOSIS — G5603 Carpal tunnel syndrome, bilateral upper limbs: Secondary | ICD-10-CM | POA: Diagnosis not present

## 2022-08-18 DIAGNOSIS — Z0279 Encounter for issue of other medical certificate: Secondary | ICD-10-CM

## 2022-08-18 MED ORDER — RIZATRIPTAN BENZOATE 10 MG PO TABS: 10.0000 mg | ORAL_TABLET | ORAL | 5 refills | Status: AC | PRN

## 2022-08-18 NOTE — Progress Notes (Signed)
FMLA Forms Received.

## 2022-08-18 NOTE — Patient Instructions (Signed)
Magnesium citrate 400mg  daily Rizatriptan as needed.  Limit use of pain relievers to no more than 2 days out of week to prevent risk of rebound or medication-overuse headache. Nerve conduction study both arms Keep headache diary

## 2022-09-04 ENCOUNTER — Telehealth: Payer: Self-pay | Admitting: Neurology

## 2022-09-04 NOTE — Telephone Encounter (Signed)
Patient states that she is calling to see if we got her FMLA paperwork and if it has been filled out. They have not gotten anything from [place]

## 2022-09-10 ENCOUNTER — Encounter: Payer: No Typology Code available for payment source | Admitting: Neurology

## 2022-09-17 ENCOUNTER — Telehealth: Payer: Self-pay

## 2022-09-17 ENCOUNTER — Encounter: Payer: Self-pay | Admitting: Neurology

## 2022-09-17 ENCOUNTER — Ambulatory Visit (INDEPENDENT_AMBULATORY_CARE_PROVIDER_SITE_OTHER): Payer: No Typology Code available for payment source | Admitting: Neurology

## 2022-09-17 DIAGNOSIS — G5603 Carpal tunnel syndrome, bilateral upper limbs: Secondary | ICD-10-CM | POA: Diagnosis not present

## 2022-09-17 NOTE — Telephone Encounter (Signed)
Patient stopped by to pick up copy of FMLA forms.

## 2022-09-17 NOTE — Procedures (Signed)
[facility]  [address] [place], [address]  [place], [place] [address] Tel: [phone] Fax: [phone] Test Date:  09/17/2022  Patient: Dana Adkins DOB: 23-May-1985 Physician: Nita Sickle, DO  Sex: Female Height: 5\' 6"  Ref Phys: Shon Millet, DO  ID#: [id number]   Technician:    History: This is a 37 year old female referred for evaluation of bilateral hand paresthesias.  NCV & EMG Findings: Extensive electrodiagnostic testing of the right upper extremity and additional studies of the left shows: Bilateral median, ulnar, and mixed palmar sensory responses are within normal limits. Bilateral median and ulnar motor responses are within normal limits. There is no evidence of active or chronic motor axonal loss changes affecting any of the tested muscles.  Motor unit configuration and recruitment pattern is within normal limits.  Impression: This is a normal study of the upper extremities.  In particular, there is no evidence of carpal tunnel syndrome or a cervical radiculopathy.   ___________________________ Nita Sickle, DO    Nerve Conduction Studies   Stim Site NR Peak (ms) Norm Peak (ms) O-P Amp (V) Norm O-P Amp  Left Median Anti Sensory (2nd Digit)  32 C  Wrist    3.1 <3.4 30.4 >20  Right Median Anti Sensory (2nd Digit)  32 C  Wrist    2.8 <3.4 22.7 >20  Left Ulnar Anti Sensory (5th Digit)  32 C  Wrist    2.6 <3.1 24.7 >12  Right Ulnar Anti Sensory (5th Digit)  32 C  Wrist    2.7 <3.1 20.9 >12     Stim Site NR Onset (ms) Norm Onset (ms) O-P Amp (mV) Norm O-P Amp Site1 Site2 Delta-0 (ms) Dist (cm) Vel (m/s) Norm Vel (m/s)  Left Median Motor (Abd Poll Brev)  32 C  Wrist    2.9 <3.9 10.3 >6 Elbow Wrist 5.4 30.0 56 >50  Elbow    8.3  9.8         Right Median Motor (Abd Poll Brev)  32 C  Wrist    2.9 <3.9 11.6 >6 Elbow Wrist 4.6 30.0 65 >50  Elbow    7.5  10.6         Left Ulnar Motor (Abd Dig Minimi)  32 C  Wrist    2.3 <3.1 9.6 >7 B Elbow Wrist 3.7  22.0 59 >50  B Elbow    6.0  9.6  A Elbow B Elbow 1.4 8.0 57 >50  A Elbow    7.4  9.3         Right Ulnar Motor (Abd Dig Minimi)  32 C  Wrist    2.7 <3.1 9.4 >7 B Elbow Wrist 3.6 23.0 64 >50  B Elbow    6.3  9.6  A Elbow B Elbow 1.4 8.0 57 >50  A Elbow    7.7  9.5            Stim Site NR Peak (ms) Norm Peak (ms) P-T Amp (V) Site1 Site2 Delta-P (ms) Norm Delta (ms)  Left Median/Ulnar Palm Comparison (Wrist - 8cm)  32 C  Median Palm    1.6 <2.2 37.2 Median Palm Ulnar Palm 0.1   Ulnar Palm    1.5 <2.2 15.4      Right Median/Ulnar Palm Comparison (Wrist - 8cm)  32 C  Median Palm    1.4 <2.2 51.4 Median Palm Ulnar Palm 0.0   Ulnar Palm    1.4 <2.2 12.1       Electromyography  Side Muscle Ins.Act Fibs Fasc Recrt Amp Dur Poly Activation Comment  Right 1stDorInt Nml Nml Nml Nml Nml Nml Nml Nml N/A  Right PronatorTeres Nml Nml Nml Nml Nml Nml Nml Nml N/A  Right Biceps Nml Nml Nml Nml Nml Nml Nml Nml N/A  Right Triceps Nml Nml Nml Nml Nml Nml Nml Nml N/A  Right Deltoid Nml Nml Nml Nml Nml Nml Nml Nml N/A  Left 1stDorInt Nml Nml Nml Nml Nml Nml Nml Nml N/A  Left PronatorTeres Nml Nml Nml Nml Nml Nml Nml Nml N/A  Left Biceps Nml Nml Nml Nml Nml Nml Nml Nml N/A  Left Triceps Nml Nml Nml Nml Nml Nml Nml Nml N/A  Left Deltoid Nml Nml Nml Nml Nml Nml Nml Nml N/A      Waveforms:

## 2022-09-21 NOTE — Progress Notes (Signed)
EMG results given.

## 2023-02-16 NOTE — Progress Notes (Deleted)
NEUROLOGY FOLLOW UP OFFICE NOTE  Dana Adkins [id number]  Assessment/Plan:   Migraine with aura, without status migrainosus, not intractable Bilateral carpal tunnel syndrome    Migraine prevention:  magnesium Migraine rescue:  rizatriptan Limit use of pain relievers to no more than 2 days out of week to prevent risk of rebound or medication-overuse headache. Keep headache diary NCV-EMG bilateral upper extremities Follow up 6 months.       Subjective:  Dana Adkins is a 37 year old female who follows up for migraine.  UPDATE: NCV-EMG of upper extremities on 09/17/2022 was normal.  Recommended bilateral median nerve ultrasound.  ***   Intensity:  5/10-8/10 Duration:  2 hours if rizatriptan taken quickly, otherwise sleeps for up to 6 hours and wakes up without headache Frequency:  3 a month  Since she was in the service 10 years ago, she has suffered from carpal tunnel syndrome.  She has used splints.  Hands are numb and feel weak.  Current NSAIDS/analgesics:  none Current triptans:  rizatriptan 10mg  Current ergotamine:  none Current anti-emetic:  none Current muscle relaxants:  none Current Antihypertensive medications:  none Current Antidepressant medications:  none Current Anticonvulsant medications:  none Current anti-CGRP:  none Current Vitamins/Herbal/Supplements:  magnesium Current Antihistamines/Decongestants:  none Other therapy:  none Birth control:  none     Caffeine:  occasional Pepsi (twice a week).  No coffee Alcohol:  no Smoker:  just quit 3 months ago Diet:  over 1/2 gallon water daily.  Skips meals Exercise:  walks  Depression:  yes; Anxiety:  yes Other pain:  back pain Sleep hygiene:  trouble falling asleep.  Sometimes difficulty staying asleep.  Avg 4-6 hours sleep a night.  HISTORY:  Onset:  37 years old Location:  right (sometimes left) retro-orbital and can radiate to back of head Quality:  pounding/pulsating Intensity:  severe.    Aura:  white light in her vision preceding headache Prodrome:  absent Associated symptoms:  photophobia.  Sometimes nausea and phonophobia.  She denies associated unilateral numbness or weakness. Duration:  6 hours  Frequency:  once a week Frequency of abortive medication: once a week Triggers:  salty foods, pork Relieving factors:  laying down in rest Activity:  aggravates   Currently taking fertility medications.     Past NSAIDS/analgesics:  ibuprofen, Tylenol, Excedrin, meloxicam Past abortive triptans:  none Past abortive ergotamine:  none Past muscle relaxants:  none Past anti-emetic:  none Past antihypertensive medications:  none Past antidepressant medications:  none Past anticonvulsant medications:  none Past anti-CGRP:  none Past vitamins/Herbal/Supplements:  none Past antihistamines/decongestants:  none Other past therapies:  none    Family history of headache:  no  PAST MEDICAL HISTORY: Past Medical History:  Diagnosis Date   Asthma    Eczema    Obesity (BMI 35.0-39.9 without comorbidity)    Plantar fasciitis, bilateral    Tobacco abuse     MEDICATIONS: Current Outpatient Medications on File Prior to Visit  Medication Sig Dispense Refill   DUPIXENT 300 MG/2ML SOPN Inject into the skin.     rizatriptan (MAXALT) 10 MG tablet Take 1 tablet (10 mg total) by mouth as needed for migraine. May repeat in 2 hours if needed.  Maximum 2 tablets in 24 hours. 10 tablet 5   triamcinolone cream (KENALOG) 0.1 % Apply 1 application topically 2 (two) times daily. 453.6 g 2   No current facility-administered medications on file prior to visit.    ALLERGIES: No Known  Allergies  FAMILY HISTORY: Family History  Problem Relation Age of Onset   Liver cancer Father 61   Stomach cancer Neg Hx    Colon cancer Neg Hx    Esophageal cancer Neg Hx    Pancreatic cancer Neg Hx       Objective:  *** General: No acute distress.  Patient appears well-groomed.   Head:   Normocephalic/atraumatic Neck:  Supple.  No paraspinal tenderness.  Full range of motion. Heart:  Regular rate and rhythm. Neuro:  Alert and oriented.  Speech fluent and not dysarthric.  Language intact.  CN II-XII intact.  Bulk and tone normal.  Muscle strength 5/5 throughout.  Deep tendon reflexes 2+ throughout.  Gait normal.  Romberg negative.   Shon Millet, DO

## 2023-02-17 ENCOUNTER — Ambulatory Visit: Payer: No Typology Code available for payment source | Admitting: Neurology

## 2023-02-17 DIAGNOSIS — Z029 Encounter for administrative examinations, unspecified: Secondary | ICD-10-CM

## 2023-02-18 ENCOUNTER — Encounter: Payer: Self-pay | Admitting: Neurology

## 2023-03-05 ENCOUNTER — Telehealth: Payer: No Typology Code available for payment source | Admitting: Family Medicine

## 2023-03-05 DIAGNOSIS — S99921A Unspecified injury of right foot, initial encounter: Secondary | ICD-10-CM | POA: Diagnosis not present

## 2023-03-05 DIAGNOSIS — W19XXXA Unspecified fall, initial encounter: Secondary | ICD-10-CM

## 2023-03-05 NOTE — Progress Notes (Signed)
Virtual Visit Consent   Dana Adkins, you are scheduled for a virtual visit with a [facility] provider today. Just as with appointments in the office, your consent must be obtained to participate. Your consent will be active for this visit and any virtual visit you may have with one of our providers in the next 365 days. If you have a MyChart account, a copy of this consent can be sent to you electronically.  As this is a virtual visit, video technology does not allow for your provider to perform a traditional examination. This may limit your provider's ability to fully assess your condition. If your provider identifies any concerns that need to be evaluated in person or the need to arrange testing (such as labs, EKG, etc.), we will make arrangements to do so. Although advances in technology are sophisticated, we cannot ensure that it will always work on either your end or our end. If the connection with a video visit is poor, the visit may have to be switched to a telephone visit. With either a video or telephone visit, we are not always able to ensure that we have a secure connection.  By engaging in this virtual visit, you consent to the provision of healthcare and authorize for your insurance to be billed (if applicable) for the services provided during this visit. Depending on your insurance coverage, you may receive a charge related to this service.  I need to obtain your verbal consent now. Are you willing to proceed with your visit today? Dana Adkins has provided verbal consent on 03/05/2023 for a virtual visit (video or telephone). Georgana Curio, FNP  Date: 03/05/2023 3:21 PM  Virtual Visit via Video Note   I, Georgana Curio, connected with  Dana Adkins  ([id number], Mar 24, 1986) on 03/05/23 at  3:15 PM EST by a video-enabled telemedicine application and verified that I am speaking with the correct person using two identifiers.  Location: Patient: Virtual Visit Location Patient:  Home Provider: Virtual Visit Location Provider: Home Office   I discussed the limitations of evaluation and management by telemedicine and the availability of in person appointments. The patient expressed understanding and agreed to proceed.    History of Present Illness: Dana Adkins is a 37 y.o. who identifies as a female who was assigned female at birth, and is being seen today for right foot injury yesterday, Seen in ED. Neg Xr. She works for [employer] and needs a note OOW. Se is in a boot. Marland Kitchen  HPI: HPI  Problems:  Patient Active Problem List   Diagnosis Date Noted   Transaminitis 11/22/2019   Tobacco abuse    Obesity (BMI 35.0-39.9 without comorbidity)    Eczema     Allergies: No Known Allergies Medications:  Current Outpatient Medications:    DUPIXENT 300 MG/2ML SOPN, Inject into the skin., Disp: , Rfl:    rizatriptan (MAXALT) 10 MG tablet, Take 1 tablet (10 mg total) by mouth as needed for migraine. May repeat in 2 hours if needed.  Maximum 2 tablets in 24 hours., Disp: 10 tablet, Rfl: 5   triamcinolone cream (KENALOG) 0.1 %, Apply 1 application topically 2 (two) times daily., Disp: 453.6 g, Rfl: 2  Observations/Objective: Patient is well-developed, well-nourished in no acute distress.  Resting comfortably  at home.  Head is normocephalic, atraumatic.  No labored breathing.  Speech is clear and coherent with logical content.  Patient is alert and oriented at baseline.    Assessment and Plan:  1. Fall, initial encounter  2. Injury of right foot, initial encounter  Elevate, ice, continue naprosyn, UC if sx worsen.   Follow Up Instructions: I discussed the assessment and treatment plan with the patient. The patient was provided an opportunity to ask questions and all were answered. The patient agreed with the plan and demonstrated an understanding of the instructions.  A copy of instructions were sent to the patient via MyChart unless otherwise noted below.     The  patient was advised to call back or seek an in-person evaluation if the symptoms worsen or if the condition fails to improve as anticipated.    Georgana Curio, FNP

## 2023-03-05 NOTE — Patient Instructions (Signed)
Foot Sprain  A foot sprain is an injury to one of the ligaments in the feet. Ligaments are strong tissues that connect bones to each other. The ligament can be stretched too much. In some cases, it may tear. A tear can be either partial or complete. The severity of the sprain depends on how much of the ligament was damaged or torn. What are the causes? This condition is usually caused by suddenly twisting or pivoting your foot. What increases the risk? You are more likely to develop this condition if: You play a sport, such as basketball or football. You exercise or play a sport without first warming up your muscles. You start a new workout or sport. You suddenly increase how long or hard you exercise or play a sport. You have injured your foot or ankle before. What are the signs or symptoms? Symptoms of this condition start soon after an injury and include: Pain, especially in the arch of your foot. Bruising. Swelling. Being unable to walk or use your foot to support body weight. How is this diagnosed? This condition is diagnosed with a medical history and physical exam. You may also have imaging tests, such as: X-rays to check for broken bones (fractures). An MRI to see if the ligament is torn. How is this treated? Treatment for this condition depends on the severity of the sprain. Mild sprains and major sprains can be treated with: Rest, ice, pressure (compression), and elevation (RICE). Elevation means raising your injured foot. Keeping your foot in a fixed position (immobilization) for a period of time. This is done if your ligament is overstretched or partially torn. Your health care provider will apply a bandage, splint, or walking boot to keep your foot from moving until it heals. Using crutches or a scooter for a few weeks to avoid bearing weight on your foot while it is healing. Physical therapy exercises to improve movement and strength in your foot. Major sprains may also be  treated with: Surgery. This is done if your ligament is fully torn and a procedure is needed to reconnect it to the bone. A cast or splint. This will be needed after surgery. A cast or splint will need to stay on your foot while it heals. Follow these instructions at home: If you have a bandage, splint, or boot: Wear it as told by your health care provider. Remove it only as told by your health care provider. Loosen it if your toes tingle, become numb, or turn cold and blue. Keep it clean and dry. If you have a cast: Do not put pressure on any part of the cast until it is fully hardened. This may take several hours. Do not stick anything inside the cast to scratch your skin. Doing that increases your risk for infection. Check the skin around the cast every day. Tell your health care provider about any concerns. You may put lotion on dry skin around the edges of the cast. Do not put lotion on the skin underneath the cast. Keep it clean and dry. Bathing Do not take baths, swim, or use a hot tub until your health care provider approves. Ask your health care provider if you may take showers. You may only be allowed to take sponge baths. If the bandage, splint, boot, or cast is not waterproof: Do not let it get wet. Cover it with a watertight covering when you take a bath or shower. Managing pain, stiffness, and swelling  If directed, put ice on the  injured area. To do this: If you have a removable bandage, splint, or boot, remove it as told by your health care provider. Put ice in a plastic bag. Place a towel between your skin and the bag, or between your cast and the bag. Leave the ice on for 20 minutes, 2-3 times per day. Remove the ice if your skin turns bright red. This is very important. If you cannot feel pain, heat, or cold, you have a greater risk of damage to the area. Move your toes often to reduce stiffness and swelling. Elevate the injured area above the level of your heart while  you are sitting or lying down. Activity Do not use the injured foot to support your body weight until your health care provider says that you can. Use crutches or a scooter as told by your health care provider. Ask your health care provider what activities are safe for you. Do exercises as told by your health care provider. Gradually increase how much and how far you walk until your health care provider says it is safe to return to full activity. Driving Ask your health care provider if the medicine prescribed to you requires you to avoid driving or using machinery. Ask your health care provider when it is safe to drive if you have a bandage, splint, boot, or cast on your foot. General instructions Take over-the-counter and prescription medicines only as told by your health care provider. When you can walk without pain, wear supportive shoes that have stiff soles. Do not wear flip-flops. Do not walk barefoot. Keep all follow-up visits. This is important. Contact a health care provider if: Medicine does not help your pain. Your bruising or swelling gets worse or does not get better with treatment. Your splint, boot, or cast is damaged. Get help right away if: You develop severe numbness or tingling in your foot. Your foot turns blue, white, or gray, and it feels cold. Summary A foot sprain is an injury to one of the ligaments in the feet. Ligaments are strong tissues that connect bones to each other. You may need a bandage, splint, boot, or cast to support your foot while it heals. Sometimes, surgery may be needed. You may need physical therapy exercises to improve movement and strength in your foot. This information is not intended to replace advice given to you by your health care provider. Make sure you discuss any questions you have with your health care provider. Document Revised: 07/28/2019 Document Reviewed: 07/28/2019 Elsevier Patient Education  2024 [employer].

## 2023-10-05 ENCOUNTER — Telehealth: Admitting: Physician Assistant

## 2023-10-05 DIAGNOSIS — J4541 Moderate persistent asthma with (acute) exacerbation: Secondary | ICD-10-CM | POA: Diagnosis not present

## 2023-10-05 MED ORDER — PREDNISONE 20 MG PO TABS: 40.0000 mg | ORAL_TABLET | Freq: Every day | ORAL | 0 refills | Status: AC

## 2023-10-05 MED ORDER — PROMETHAZINE-DM 6.25-15 MG/5ML PO SYRP
5.0000 mL | ORAL_SOLUTION | Freq: Four times a day (QID) | ORAL | 0 refills | Status: AC | PRN
Start: 2023-10-05 — End: ?

## 2023-10-05 NOTE — Patient Instructions (Signed)
 Darlen Eglin, thank you for joining Angelia Kelp, PA-C for today's virtual visit.  While this provider is not your primary care provider (PCP), if your PCP is located in our provider database this encounter information will be shared with them immediately following your visit.   A [facility] MyChart account gives you access to today's visit and all your visits, tests, and labs performed at [facility]  click here if you don't have a [facility] MyChart account or go to [email or web address]  Consent: (Patient) Dana Adkins provided verbal consent for this virtual visit at the beginning of the encounter.  Current Medications:  Current Outpatient Medications:    DUPIXENT 300 MG/2ML SOPN, Inject into the skin., Disp: , Rfl:    predniSONE (DELTASONE) 20 MG tablet, Take 2 tablets (40 mg total) by mouth daily with breakfast., Disp: 14 tablet, Rfl: 0   promethazine-dextromethorphan (PROMETHAZINE-DM) 6.25-15 MG/5ML syrup, Take 5 mLs by mouth 4 (four) times daily as needed., Disp: 118 mL, Rfl: 0   rizatriptan  (MAXALT ) 10 MG tablet, Take 1 tablet (10 mg total) by mouth as needed for migraine. May repeat in 2 hours if needed.  Maximum 2 tablets in 24 hours., Disp: 10 tablet, Rfl: 5   triamcinolone  cream (KENALOG ) 0.1 %, Apply 1 application topically 2 (two) times daily., Disp: 453.6 g, Rfl: 2   Medications ordered in this encounter:  Meds ordered this encounter  Medications   predniSONE (DELTASONE) 20 MG tablet    Sig: Take 2 tablets (40 mg total) by mouth daily with breakfast.    Dispense:  14 tablet    Refill:  0    Supervising Provider:   LAMPTEY, PHILIP O [[id number]]   promethazine-dextromethorphan (PROMETHAZINE-DM) 6.25-15 MG/5ML syrup    Sig: Take 5 mLs by mouth 4 (four) times daily as needed.    Dispense:  118 mL    Refill:  0    Supervising Provider:   Corine Dice [[id number]]     *If you need refills on other medications prior to your next appointment,  please contact your pharmacy*  Follow-Up: Call back or seek an in-person evaluation if the symptoms worsen or if the condition fails to improve as anticipated.  [facility] [phone]  Other Instructions  Asthma Triggers and Exacerbation In this video, you will learn about exposures and other factors that can lead to asthma attacks and trouble controlling asthma. To view the content, go to this web address: [email or web address]  This video will expire on: 03/31/2025. If you need access to this video following this date, please reach out to the healthcare provider who assigned it to you. This information is not intended to replace advice given to you by your health care provider. Make sure you discuss any questions you have with your health care provider. Elsevier Patient Education  2024 [employer]   If you have been instructed to have an in-person evaluation today at a local Urgent Care facility, please use the link below. It will take you to a list of all of our available [facility], including address, phone number and hours of operation. Please do not delay care.  [facility]  If you or a family member do not have a primary care provider, use the link below to schedule a visit and establish care. When you choose a [facility] primary care physician or advanced practice provider, you gain a long-term partner in health. Find a Primary Care Provider  Learn more about [facility]'s in-office and virtual care options: [facility] - Get Care Now

## 2023-10-05 NOTE — Progress Notes (Signed)
 Virtual Visit Consent   Dana Adkins, you are scheduled for a virtual visit with a [facility] provider today. Just as with appointments in the office, your consent must be obtained to participate. Your consent will be active for this visit and any virtual visit you may have with one of our providers in the next 365 days. If you have a MyChart account, a copy of this consent can be sent to you electronically.  As this is a virtual visit, video technology does not allow for your provider to perform a traditional examination. This may limit your provider's ability to fully assess your condition. If your provider identifies any concerns that need to be evaluated in person or the need to arrange testing (such as labs, EKG, etc.), we will make arrangements to do so. Although advances in technology are sophisticated, we cannot ensure that it will always work on either your end or our end. If the connection with a video visit is poor, the visit may have to be switched to a telephone visit. With either a video or telephone visit, we are not always able to ensure that we have a secure connection.  By engaging in this virtual visit, you consent to the provision of healthcare and authorize for your insurance to be billed (if applicable) for the services provided during this visit. Depending on your insurance coverage, you may receive a charge related to this service.  I need to obtain your verbal consent now. Are you willing to proceed with your visit today? Dana Adkins has provided verbal consent on 10/05/2023 for a virtual visit (video or telephone). Angelia Kelp, PA-C  Date: 10/05/2023 3:09 PM   Virtual Visit via Video Note   I, Angelia Kelp, connected with  Dana Adkins  ([id number], [birth year]) on 10/05/23 at  3:00 PM EDT by a video-enabled telemedicine application and verified that I am speaking with the correct person using two identifiers.  Location: Patient: Virtual Visit  Location Patient: Home Provider: Virtual Visit Location Provider: Home Office   I discussed the limitations of evaluation and management by telemedicine and the availability of in person appointments. The patient expressed understanding and agreed to proceed.    History of Present Illness: Dana Adkins is a 38 y.o. who identifies as a female who was assigned female at birth, and is being seen today for cough and congestion.  HPI: URI  This is a new problem. The current episode started yesterday. The problem has been gradually worsening. There has been no fever. Associated symptoms include congestion, coughing, headaches, rhinorrhea, sneezing, a sore throat (from coughing) and wheezing. Pertinent negatives include no chest pain, diarrhea, ear pain, nausea, plugged ear sensation, sinus pain or vomiting. She has tried inhaler use (mucinex, theraflu, albuterol) for the symptoms. The treatment provided no relief.  Had been outside with her helmet visor up during a motorcycle safety course and feels the heat, environmental allergens, and exhaust triggered.   PMH: asthma  Problems:  Patient Active Problem List   Diagnosis Date Noted   Transaminitis 11/22/2019   Tobacco abuse    Obesity (BMI 35.0-39.9 without comorbidity)    Eczema     Allergies: No Known Allergies Medications:  Current Outpatient Medications:    DUPIXENT 300 MG/2ML SOPN, Inject into the skin., Disp: , Rfl:    predniSONE (DELTASONE) 20 MG tablet, Take 2 tablets (40 mg total) by mouth daily with breakfast., Disp: 14 tablet, Rfl: 0   promethazine-dextromethorphan (PROMETHAZINE-DM) 6.25-15  MG/5ML syrup, Take 5 mLs by mouth 4 (four) times daily as needed., Disp: 118 mL, Rfl: 0   rizatriptan  (MAXALT ) 10 MG tablet, Take 1 tablet (10 mg total) by mouth as needed for migraine. May repeat in 2 hours if needed.  Maximum 2 tablets in 24 hours., Disp: 10 tablet, Rfl: 5   triamcinolone  cream (KENALOG ) 0.1 %, Apply 1 application topically  2 (two) times daily., Disp: 453.6 g, Rfl: 2  Observations/Objective: Patient is well-developed, well-nourished in no acute distress.  Resting comfortably at home.  Head is normocephalic, atraumatic.  No labored breathing.  Speech is clear and coherent with logical content.  Patient is alert and oriented at baseline.  Frequent coughing heard  Assessment and Plan: 1. Moderate persistent asthma with exacerbation (Primary) - predniSONE (DELTASONE) 20 MG tablet; Take 2 tablets (40 mg total) by mouth daily with breakfast.  Dispense: 14 tablet; Refill: 0 - promethazine-dextromethorphan (PROMETHAZINE-DM) 6.25-15 MG/5ML syrup; Take 5 mLs by mouth 4 (four) times daily as needed.  Dispense: 118 mL; Refill: 0  - Add Prednisone - Continue Albuterol - Promethazine DM for cough (drowsiness precautions discussed) - Can use other OTC medications as needed, especially if saving Promethazine DM for nighttime cough only - Steam and humidifier can help - Work note provided - Seek in person evaluation if not improving or if symptoms worsen  Follow Up Instructions: I discussed the assessment and treatment plan with the patient. The patient was provided an opportunity to ask questions and all were answered. The patient agreed with the plan and demonstrated an understanding of the instructions.  A copy of instructions were sent to the patient via MyChart unless otherwise noted below.    The patient was advised to call back or seek an in-person evaluation if the symptoms worsen or if the condition fails to improve as anticipated.    Angelia Kelp, PA-C

## 2023-12-10 ENCOUNTER — Telehealth: Admitting: Physician Assistant

## 2023-12-10 DIAGNOSIS — F418 Other specified anxiety disorders: Secondary | ICD-10-CM | POA: Diagnosis not present

## 2023-12-10 MED ORDER — HYDROXYZINE PAMOATE 25 MG PO CAPS: 25.0000 mg | ORAL_CAPSULE | Freq: Three times a day (TID) | ORAL | 0 refills | Status: AC | PRN

## 2023-12-10 NOTE — Patient Instructions (Signed)
 Dana Adkins, thank you for joining [name], PA-C for today's virtual visit.  While this provider is not your primary care provider (PCP), if your PCP is located in our provider database this encounter information will be shared with them immediately following your visit.   A [facility] MyChart account gives you access to today's visit and all your visits, tests, and labs performed at [facility]  click here if you don't have a [facility] MyChart account or go to [email or web address]  Consent: (Patient) Dana Adkins provided verbal consent for this virtual visit at the beginning of the encounter.  Current Medications:  Current Outpatient Medications:    hydrOXYzine  (VISTARIL ) 25 MG capsule, Take 1 capsule (25 mg total) by mouth every 8 (eight) hours as needed., Disp: 30 capsule, Rfl: 0   DUPIXENT 300 MG/2ML SOPN, Inject into the skin., Disp: , Rfl:    predniSONE  (DELTASONE ) 20 MG tablet, Take 2 tablets (40 mg total) by mouth daily with breakfast., Disp: 14 tablet, Rfl: 0   promethazine -dextromethorphan (PROMETHAZINE -DM) 6.25-15 MG/5ML syrup, Take 5 mLs by mouth 4 (four) times daily as needed., Disp: 118 mL, Rfl: 0   rizatriptan  (MAXALT ) 10 MG tablet, Take 1 tablet (10 mg total) by mouth as needed for migraine. May repeat in 2 hours if needed.  Maximum 2 tablets in 24 hours., Disp: 10 tablet, Rfl: 5   triamcinolone  cream (KENALOG ) 0.1 %, Apply 1 application topically 2 (two) times daily., Disp: 453.6 g, Rfl: 2   Medications ordered in this encounter:  Meds ordered this encounter  Medications   hydrOXYzine  (VISTARIL ) 25 MG capsule    Sig: Take 1 capsule (25 mg total) by mouth every 8 (eight) hours as needed.    Dispense:  30 capsule    Refill:  0    Supervising Provider:   BLAISE ALEENE KIDD [[id number]]     *If you need refills on other medications prior to your next appointment, please contact your pharmacy*  Follow-Up: Call back or seek an in-person  evaluation if the symptoms worsen or if the condition fails to improve as anticipated.   Virtual Care [phone]  Other Instructions  Managing Anxiety, Adult After being diagnosed with anxiety, you may be relieved to know why you have felt or behaved a certain way. You may also feel overwhelmed about the treatment ahead and what it will mean for your life. With care and support, you can manage your anxiety. How to manage lifestyle changes Understanding the difference between stress and anxiety Although stress can play a role in anxiety, it is not the same as anxiety. Stress is your body's reaction to life changes and events, both good and bad. Stress is often caused by something external, such as a deadline, test, or competition. It normally goes away after the event has ended and will last just a few hours. But, stress can be ongoing and can lead to more than just stress. Anxiety is caused by something internal, such as imagining a terrible outcome or worrying that something will go wrong that will greatly upset you. Anxiety often does not go away even after the event is over, and it can become a long-term (chronic) worry. Lowering stress and anxiety Talk with your health care provider or a counselor to learn more about lowering anxiety and stress. They may suggest tension-reduction techniques, such as: Music. Spend time creating or listening to music that you enjoy and that inspires you. Mindfulness-based meditation. Practice being aware  of your normal breaths while not trying to control your breathing. It can be done while sitting or walking. Centering prayer. Focus on a word, phrase, or sacred image that means something to you and brings you peace. Deep breathing. Expand your stomach and inhale slowly through your nose. Hold your breath for 3-5 seconds. Then breathe out slowly, letting your stomach muscles relax. Self-talk. Learn to notice and spot thought patterns that lead to  anxiety reactions. Change those patterns to thoughts that feel peaceful. Muscle relaxation. Take time to tense muscles and then relax them. Choose a tension-reduction technique that fits your lifestyle and personality. These techniques take time and practice. Set aside 5-15 minutes a day to do them. Specialized therapists can offer counseling and training in these techniques. The training to help with anxiety may be covered by some insurance plans. Other things you can do to manage stress and anxiety include: Keeping a stress diary. This can help you learn what triggers your reaction and then learn ways to manage your response. Thinking about how you react to certain situations. You may not be able to control everything, but you can control your response. Making time for activities that help you relax and not feeling guilty about spending your time in this way. Doing visual imagery. This involves imagining or creating mental pictures to help you relax. Practicing yoga. Through yoga poses, you can lower tension and relax.  Medicines Medicines for anxiety include: Antidepressant medicines. These are usually prescribed for long-term daily control. Anti-anxiety medicines. These may be added in severe cases, especially when panic attacks occur. When used together, medicines, psychotherapy, and tension-reduction techniques may be the most effective treatment. Relationships Relationships can play a big part in helping you recover. Spend more time connecting with trusted friends and family members. Think about going to couples counseling if you have a partner, taking family education classes, or going to family therapy. Therapy can help you and others better understand your anxiety. How to recognize changes in your anxiety Everyone responds differently to treatment for anxiety. Recovery from anxiety happens when symptoms lessen and stop interfering with your daily life at home or work. This may mean that  you will start to: Have better concentration and focus. Worry will interfere less in your daily thinking. Sleep better. Be less irritable. Have more energy. Have improved memory. Try to recognize when your condition is getting worse. Contact your provider if your symptoms interfere with home or work and you feel like your condition is not improving. Follow these instructions at home: Activity Exercise. Adults should: Exercise for at least 150 minutes each week. The exercise should increase your heart rate and make you sweat (moderate-intensity exercise). Do strengthening exercises at least twice a week. Get the right amount and quality of sleep. Most adults need 7-9 hours of sleep each night. Lifestyle  Eat a healthy diet that includes plenty of vegetables, fruits, whole grains, low-fat dairy products, and lean protein. Do not eat a lot of foods that are high in fats, added sugars, or salt (sodium). Make choices that simplify your life. Do not use any products that contain nicotine  or tobacco. These products include cigarettes, chewing tobacco, and vaping devices, such as e-cigarettes. If you need help quitting, ask your provider. Avoid caffeine, alcohol, and certain over-the-counter cold medicines. These may make you feel worse. Ask your pharmacist which medicines to avoid. General instructions Take over-the-counter and prescription medicines only as told by your provider. Keep all follow-up visits. This is  to make sure you are managing your anxiety well or if you need more support. Where to find support You can get help and support from: Self-help groups. Online and [employer]. A trusted spiritual leader. Couples counseling. Family education classes. Family therapy. Where to find more information You may find that joining a support group helps you deal with your anxiety. The following sources can help you find counselors or support groups near you: Mental Health  [place]: mentalhealthamerica.net Anxiety and Depression Association of [place] (ADAA): adaa.org The [employer] on Mental Illness (NAMI): nami.org Contact a health care provider if: You have a hard time staying focused or finishing tasks. You spend many hours a day feeling worried about everyday life. You are very tired because you cannot stop worrying. You start to have headaches or often feel tense. You have chronic nausea or diarrhea. Get help right away if: Your heart feels like it is racing. You have shortness of breath. You have thoughts of hurting yourself or others. Get help right away if you feel like you may hurt yourself or others, or have thoughts about taking your own life. Go to your nearest emergency room or: Call 911. Call the National Suicide Prevention Lifeline at 714-488-4799 or 988. This is open 24 hours a day. Text the Crisis Text Line at 626-107-1946. This information is not intended to replace advice given to you by your health care provider. Make sure you discuss any questions you have with your health care provider. Document Revised: 01/13/2022 Document Reviewed: 07/28/2020 Elsevier Patient Education  2024 Elsevier Inc.   If you have been instructed to have an in-person evaluation today at a local Urgent Care facility, please use the link below. It will take you to a list of all of our available [facility], including address, phone number and hours of operation. Please do not delay care.  [facility]  If you or a family member do not have a primary care provider, use the link below to schedule a visit and establish care. When you choose a [facility] primary care physician or advanced practice provider, you gain a long-term partner in health. Find a Primary Care Provider  Learn more about [facility]'s in-office and virtual care options: [facility] - Get Care Now

## 2023-12-10 NOTE — Progress Notes (Signed)
 Virtual Visit Consent   Dana Adkins, you are scheduled for a virtual visit with a [facility] provider today. Just as with appointments in the office, your consent must be obtained to participate. Your consent will be active for this visit and any virtual visit you may have with one of our providers in the next 365 days. If you have a MyChart account, a copy of this consent can be sent to you electronically.  As this is a virtual visit, video technology does not allow for your provider to perform a traditional examination. This may limit your provider's ability to fully assess your condition. If your provider identifies any concerns that need to be evaluated in person or the need to arrange testing (such as labs, EKG, etc.), we will make arrangements to do so. Although advances in technology are sophisticated, we cannot ensure that it will always work on either your end or our end. If the connection with a video visit is poor, the visit may have to be switched to a telephone visit. With either a video or telephone visit, we are not always able to ensure that we have a secure connection.  By engaging in this virtual visit, you consent to the provision of healthcare and authorize for your insurance to be billed (if applicable) for the services provided during this visit. Depending on your insurance coverage, you may receive a charge related to this service.  I need to obtain your verbal consent now. Are you willing to proceed with your visit today? Dana Adkins has provided verbal consent on 12/10/2023 for a virtual visit (video or telephone). Dana CHRISTELLA Dickinson, PA-C  Date: 12/10/2023 5:08 PM   Virtual Visit via Video Note   I, Dana Adkins, connected with  Dana Adkins  ([id number], [birth year]) on 12/10/23 at  4:45 PM EDT by a video-enabled telemedicine application and verified that I am speaking with the correct person using two identifiers.  Location: Patient: Virtual Visit  Location Patient: Home Provider: Virtual Visit Location Provider: Home Office   I discussed the limitations of evaluation and management by telemedicine and the availability of in person appointments. The patient expressed understanding and agreed to proceed.    History of Present Illness: Dana Adkins is a 38 y.o. who identifies as a female who was assigned female at birth, and is being seen today for acute anxiety and a migraine.  HPI: Migraine  This is a new problem. The current episode started yesterday (started last night). The problem occurs constantly. The problem has been unchanged. The pain is located in the Right unilateral and retro-orbital region. The pain does not radiate. The pain quality is similar to prior headaches. The quality of the pain is described as pulsating (pressure). The pain is mild. Associated symptoms include phonophobia, photophobia and a visual change (visual auras). Pertinent negatives include no blurred vision, dizziness, eye pain, eye redness, eye watering, hearing loss, loss of balance, nausea, tinnitus, vomiting or weakness. The symptoms are aggravated by bright light. She has tried darkened room and triptans (Rizatriptan ) for the symptoms. The treatment provided no relief. Her past medical history is significant for migraine headaches.    Anxiety: Started about 3 days ago. Is a disabled vet with PTSD so has intermittent episodes of acute anxiety attacks, especially when things are not going as scheduled or are out of the ordinary. Does try to exercise through them which normally helps, but occasionally will have times that she struggles to get  under control. Has not wanted a daily medication or other anxiety medications in the past as she has seen her SO go through side effects and withdrawal symptoms that make her avoid those medications.   Problems:  Patient Active Problem List   Diagnosis Date Noted   Transaminitis 11/22/2019   Tobacco abuse    Obesity  (BMI 35.0-39.9 without comorbidity)    Eczema     Allergies: No Known Allergies Medications:  Current Outpatient Medications:    hydrOXYzine  (VISTARIL ) 25 MG capsule, Take 1 capsule (25 mg total) by mouth every 8 (eight) hours as needed., Disp: 30 capsule, Rfl: 0   DUPIXENT 300 MG/2ML SOPN, Inject into the skin., Disp: , Rfl:    predniSONE  (DELTASONE ) 20 MG tablet, Take 2 tablets (40 mg total) by mouth daily with breakfast., Disp: 14 tablet, Rfl: 0   promethazine -dextromethorphan (PROMETHAZINE -DM) 6.25-15 MG/5ML syrup, Take 5 mLs by mouth 4 (four) times daily as needed., Disp: 118 mL, Rfl: 0   rizatriptan  (MAXALT ) 10 MG tablet, Take 1 tablet (10 mg total) by mouth as needed for migraine. May repeat in 2 hours if needed.  Maximum 2 tablets in 24 hours., Disp: 10 tablet, Rfl: 5   triamcinolone  cream (KENALOG ) 0.1 %, Apply 1 application topically 2 (two) times daily., Disp: 453.6 g, Rfl: 2  Observations/Objective: Patient is well-developed, well-nourished in no acute distress.  Resting comfortably at home.  Head is normocephalic, atraumatic.  No labored breathing.  Speech is clear and coherent with logical content.  Patient is alert and oriented at baseline.    Assessment and Plan: 1. Situational anxiety (Primary) - hydrOXYzine  (VISTARIL ) 25 MG capsule; Take 1 capsule (25 mg total) by mouth every 8 (eight) hours as needed.  Dispense: 30 capsule; Refill: 0  - For the migraine, continue Maxalt  as prescribed - Discussed cold compress to face or neck and putting feet in hot water to try to break a migraine - Reach back out if migraine last more than 3-4 days - Hydroxyzine  added for as needed anxiety, drowsiness precautions discussed - Push fluids - Keep scheduled follow up with PCP - Seek further evaluation if symptoms worsen or fail to improve  Follow Up Instructions: I discussed the assessment and treatment plan with the patient. The patient was provided an opportunity to ask questions  and all were answered. The patient agreed with the plan and demonstrated an understanding of the instructions.  A copy of instructions were sent to the patient via MyChart unless otherwise noted below.    The patient was advised to call back or seek an in-person evaluation if the symptoms worsen or if the condition fails to improve as anticipated.    Dana CHRISTELLA Dickinson, PA-C
